# Patient Record
Sex: Male | Born: 1945
Health system: Southern US, Community
[De-identification: ages and names within clinical notes are randomized; demographics above are authoritative.]

## PROBLEM LIST (undated history)

## (undated) DIAGNOSIS — E785 Hyperlipidemia, unspecified: Secondary | ICD-10-CM

## (undated) DIAGNOSIS — I10 Essential (primary) hypertension: Secondary | ICD-10-CM

## (undated) HISTORY — DX: Hyperlipidemia, unspecified: E78.5

## (undated) HISTORY — DX: Essential (primary) hypertension: I10

---

## 1964-01-11 HISTORY — PX: INGUINAL HERNIA REPAIR: SUR1180

## 2001-01-17 ENCOUNTER — Ambulatory Visit (HOSPITAL_COMMUNITY): Admission: RE | Admit: 2001-01-17 | Discharge: 2001-01-17 | Payer: Self-pay | Admitting: Gastroenterology

## 2011-06-20 DIAGNOSIS — IMO0002 Reserved for concepts with insufficient information to code with codable children: Secondary | ICD-10-CM | POA: Diagnosis not present

## 2011-06-20 DIAGNOSIS — N529 Male erectile dysfunction, unspecified: Secondary | ICD-10-CM | POA: Diagnosis not present

## 2011-06-20 DIAGNOSIS — I10 Essential (primary) hypertension: Secondary | ICD-10-CM | POA: Diagnosis not present

## 2011-06-21 DIAGNOSIS — L988 Other specified disorders of the skin and subcutaneous tissue: Secondary | ICD-10-CM | POA: Diagnosis not present

## 2011-06-21 DIAGNOSIS — M79609 Pain in unspecified limb: Secondary | ICD-10-CM | POA: Diagnosis not present

## 2011-07-12 DIAGNOSIS — H524 Presbyopia: Secondary | ICD-10-CM | POA: Diagnosis not present

## 2011-07-12 DIAGNOSIS — H5200 Hypermetropia, unspecified eye: Secondary | ICD-10-CM | POA: Diagnosis not present

## 2011-07-12 DIAGNOSIS — H2510 Age-related nuclear cataract, unspecified eye: Secondary | ICD-10-CM | POA: Diagnosis not present

## 2011-08-10 DIAGNOSIS — Z125 Encounter for screening for malignant neoplasm of prostate: Secondary | ICD-10-CM | POA: Diagnosis not present

## 2011-08-10 DIAGNOSIS — I10 Essential (primary) hypertension: Secondary | ICD-10-CM | POA: Diagnosis not present

## 2011-08-10 DIAGNOSIS — E785 Hyperlipidemia, unspecified: Secondary | ICD-10-CM | POA: Diagnosis not present

## 2011-08-17 DIAGNOSIS — Z125 Encounter for screening for malignant neoplasm of prostate: Secondary | ICD-10-CM | POA: Diagnosis not present

## 2011-08-17 DIAGNOSIS — E785 Hyperlipidemia, unspecified: Secondary | ICD-10-CM | POA: Diagnosis not present

## 2011-08-17 DIAGNOSIS — Z0000 Encounter for general adult medical examination without abnormal findings: Secondary | ICD-10-CM | POA: Diagnosis not present

## 2011-08-17 DIAGNOSIS — J449 Chronic obstructive pulmonary disease, unspecified: Secondary | ICD-10-CM | POA: Diagnosis not present

## 2011-08-17 DIAGNOSIS — I10 Essential (primary) hypertension: Secondary | ICD-10-CM | POA: Diagnosis not present

## 2011-08-30 DIAGNOSIS — Z1389 Encounter for screening for other disorder: Secondary | ICD-10-CM | POA: Diagnosis not present

## 2012-09-05 DIAGNOSIS — D485 Neoplasm of uncertain behavior of skin: Secondary | ICD-10-CM | POA: Diagnosis not present

## 2012-09-05 DIAGNOSIS — L821 Other seborrheic keratosis: Secondary | ICD-10-CM | POA: Diagnosis not present

## 2012-09-05 DIAGNOSIS — L570 Actinic keratosis: Secondary | ICD-10-CM | POA: Diagnosis not present

## 2012-09-05 DIAGNOSIS — D2370 Other benign neoplasm of skin of unspecified lower limb, including hip: Secondary | ICD-10-CM | POA: Diagnosis not present

## 2012-09-05 DIAGNOSIS — D239 Other benign neoplasm of skin, unspecified: Secondary | ICD-10-CM | POA: Diagnosis not present

## 2012-09-05 DIAGNOSIS — L905 Scar conditions and fibrosis of skin: Secondary | ICD-10-CM | POA: Diagnosis not present

## 2012-09-19 DIAGNOSIS — H2510 Age-related nuclear cataract, unspecified eye: Secondary | ICD-10-CM | POA: Diagnosis not present

## 2012-09-19 DIAGNOSIS — H524 Presbyopia: Secondary | ICD-10-CM | POA: Diagnosis not present

## 2012-10-02 DIAGNOSIS — I10 Essential (primary) hypertension: Secondary | ICD-10-CM | POA: Diagnosis not present

## 2012-10-02 DIAGNOSIS — E785 Hyperlipidemia, unspecified: Secondary | ICD-10-CM | POA: Diagnosis not present

## 2012-10-02 DIAGNOSIS — Z125 Encounter for screening for malignant neoplasm of prostate: Secondary | ICD-10-CM | POA: Diagnosis not present

## 2012-10-09 DIAGNOSIS — Z125 Encounter for screening for malignant neoplasm of prostate: Secondary | ICD-10-CM | POA: Diagnosis not present

## 2012-10-09 DIAGNOSIS — J449 Chronic obstructive pulmonary disease, unspecified: Secondary | ICD-10-CM | POA: Diagnosis not present

## 2012-10-09 DIAGNOSIS — E785 Hyperlipidemia, unspecified: Secondary | ICD-10-CM | POA: Diagnosis not present

## 2012-10-09 DIAGNOSIS — N529 Male erectile dysfunction, unspecified: Secondary | ICD-10-CM | POA: Diagnosis not present

## 2012-10-09 DIAGNOSIS — I10 Essential (primary) hypertension: Secondary | ICD-10-CM | POA: Diagnosis not present

## 2012-10-09 DIAGNOSIS — Z0000 Encounter for general adult medical examination without abnormal findings: Secondary | ICD-10-CM | POA: Diagnosis not present

## 2012-10-09 DIAGNOSIS — L578 Other skin changes due to chronic exposure to nonionizing radiation: Secondary | ICD-10-CM | POA: Diagnosis not present

## 2012-10-09 DIAGNOSIS — Z23 Encounter for immunization: Secondary | ICD-10-CM | POA: Diagnosis not present

## 2012-10-09 DIAGNOSIS — M1990 Unspecified osteoarthritis, unspecified site: Secondary | ICD-10-CM | POA: Diagnosis not present

## 2012-10-24 DIAGNOSIS — Z1212 Encounter for screening for malignant neoplasm of rectum: Secondary | ICD-10-CM | POA: Diagnosis not present

## 2013-03-18 DIAGNOSIS — L819 Disorder of pigmentation, unspecified: Secondary | ICD-10-CM | POA: Diagnosis not present

## 2013-03-18 DIAGNOSIS — L259 Unspecified contact dermatitis, unspecified cause: Secondary | ICD-10-CM | POA: Diagnosis not present

## 2013-03-18 DIAGNOSIS — D1801 Hemangioma of skin and subcutaneous tissue: Secondary | ICD-10-CM | POA: Diagnosis not present

## 2013-03-18 DIAGNOSIS — L821 Other seborrheic keratosis: Secondary | ICD-10-CM | POA: Diagnosis not present

## 2013-03-18 DIAGNOSIS — D239 Other benign neoplasm of skin, unspecified: Secondary | ICD-10-CM | POA: Diagnosis not present

## 2013-03-18 DIAGNOSIS — L570 Actinic keratosis: Secondary | ICD-10-CM | POA: Diagnosis not present

## 2013-04-25 DIAGNOSIS — H9319 Tinnitus, unspecified ear: Secondary | ICD-10-CM | POA: Diagnosis not present

## 2013-04-25 DIAGNOSIS — H903 Sensorineural hearing loss, bilateral: Secondary | ICD-10-CM | POA: Diagnosis not present

## 2013-06-20 ENCOUNTER — Encounter (HOSPITAL_BASED_OUTPATIENT_CLINIC_OR_DEPARTMENT_OTHER): Payer: Self-pay

## 2013-06-20 ENCOUNTER — Ambulatory Visit (HOSPITAL_BASED_OUTPATIENT_CLINIC_OR_DEPARTMENT_OTHER)
Admission: RE | Admit: 2013-06-20 | Discharge: 2013-06-20 | Disposition: A | Discharge status: Home / Self Care | Payer: Medicare Other | Source: Ambulatory Visit | Attending: Orthopedic Surgery | Admitting: Orthopedic Surgery

## 2013-06-20 ENCOUNTER — Encounter (HOSPITAL_BASED_OUTPATIENT_CLINIC_OR_DEPARTMENT_OTHER)
Admission: RE | Disposition: A | Discharge status: Home / Self Care | Payer: Self-pay | Source: Ambulatory Visit | Attending: Orthopedic Surgery

## 2013-06-20 DIAGNOSIS — E7800 Pure hypercholesterolemia, unspecified: Secondary | ICD-10-CM | POA: Diagnosis not present

## 2013-06-20 DIAGNOSIS — S6710XA Crushing injury of unspecified finger(s), initial encounter: Secondary | ICD-10-CM | POA: Diagnosis not present

## 2013-06-20 DIAGNOSIS — S41109A Unspecified open wound of unspecified upper arm, initial encounter: Secondary | ICD-10-CM | POA: Diagnosis not present

## 2013-06-20 DIAGNOSIS — S6720XA Crushing injury of unspecified hand, initial encounter: Secondary | ICD-10-CM | POA: Diagnosis not present

## 2013-06-20 DIAGNOSIS — I10 Essential (primary) hypertension: Secondary | ICD-10-CM | POA: Insufficient documentation

## 2013-06-20 DIAGNOSIS — IMO0002 Reserved for concepts with insufficient information to code with codable children: Secondary | ICD-10-CM | POA: Insufficient documentation

## 2013-06-20 DIAGNOSIS — Y929 Unspecified place or not applicable: Secondary | ICD-10-CM | POA: Insufficient documentation

## 2013-06-20 HISTORY — PX: MINOR NAILBED REPAIR: SHX5979

## 2013-06-20 SURGERY — MINOR NAILBED REPAIR
Anesthesia: LOCAL | Laterality: Left

## 2013-06-20 MED ORDER — LIDOCAINE HCL 2 % IJ SOLN
INTRAMUSCULAR | Status: DC | PRN
  Administered 2013-06-20: 5.5 mL

## 2013-06-20 MED ORDER — 0.9 % SODIUM CHLORIDE (POUR BTL) OPTIME
TOPICAL | Status: DC | PRN
  Administered 2013-06-20: 200 mL

## 2013-06-20 MED ORDER — OXYCODONE-ACETAMINOPHEN 5-325 MG PO TABS: ORAL_TABLET | ORAL | Status: DC

## 2013-06-20 MED ORDER — LIDOCAINE HCL 2 % IJ SOLN
INTRAMUSCULAR | Status: AC
  Filled 2013-06-20: qty 20

## 2013-06-20 MED ORDER — DOXYCYCLINE HYCLATE 100 MG PO TABS: 100.0000 mg | ORAL_TABLET | Freq: Two times a day (BID) | ORAL | Status: AC

## 2013-06-20 SURGICAL SUPPLY — 42 items
BANDAGE ADH SHEER 1  50/CT (GAUZE/BANDAGES/DRESSINGS) IMPLANT
BANDAGE COBAN STERILE 2 (GAUZE/BANDAGES/DRESSINGS) IMPLANT
BLADE SURG 15 STRL LF DISP TIS (BLADE) ×1 IMPLANT
BLADE SURG 15 STRL SS (BLADE) ×1
BNDG COHESIVE 1X5 TAN STRL LF (GAUZE/BANDAGES/DRESSINGS) ×2 IMPLANT
BNDG ELASTIC 2 VLCR STRL LF (GAUZE/BANDAGES/DRESSINGS) IMPLANT
BNDG ESMARK 4X9 LF (GAUZE/BANDAGES/DRESSINGS) IMPLANT
BRUSH SCRUB EZ PLAIN DRY (MISCELLANEOUS) ×2 IMPLANT
CORDS BIPOLAR (ELECTRODE) IMPLANT
COVER MAYO STAND STRL (DRAPES) ×2 IMPLANT
CUFF TOURNIQUET SINGLE 18IN (TOURNIQUET CUFF) IMPLANT
DECANTER SPIKE VIAL GLASS SM (MISCELLANEOUS) IMPLANT
DRAIN PENROSE 1/2X12 LTX STRL (WOUND CARE) ×2 IMPLANT
DRAPE SURG 17X23 STRL (DRAPES) IMPLANT
GAUZE SPONGE 4X4 12PLY STRL (GAUZE/BANDAGES/DRESSINGS) IMPLANT
GAUZE XEROFORM 1X8 LF (GAUZE/BANDAGES/DRESSINGS) ×2 IMPLANT
GLOVE BIOGEL M STRL SZ7.5 (GLOVE) ×2 IMPLANT
GLOVE BIOGEL PI IND STRL 6.5 (GLOVE) ×1 IMPLANT
GLOVE BIOGEL PI IND STRL 7.0 (GLOVE) ×1 IMPLANT
GLOVE BIOGEL PI INDICATOR 6.5 (GLOVE) ×1
GLOVE BIOGEL PI INDICATOR 7.0 (GLOVE) ×1
GLOVE ORTHO TXT STRL SZ7.5 (GLOVE) ×2 IMPLANT
GOWN STRL REUS W/ TWL LRG LVL3 (GOWN DISPOSABLE) IMPLANT
GOWN STRL REUS W/ TWL XL LVL3 (GOWN DISPOSABLE) ×3 IMPLANT
GOWN STRL REUS W/TWL LRG LVL3 (GOWN DISPOSABLE)
GOWN STRL REUS W/TWL XL LVL3 (GOWN DISPOSABLE) ×3
NEEDLE 27GAX1X1/2 (NEEDLE) ×2 IMPLANT
PACK BASIN DAY SURGERY FS (CUSTOM PROCEDURE TRAY) ×2 IMPLANT
PADDING CAST ABS 4INX4YD NS (CAST SUPPLIES)
PADDING CAST ABS COTTON 4X4 ST (CAST SUPPLIES) IMPLANT
SPLINT FINGER 3.25 911903 (SOFTGOODS) ×2 IMPLANT
SPONGE GAUZE 4X4 12PLY STER LF (GAUZE/BANDAGES/DRESSINGS) IMPLANT
STOCKINETTE 4X48 STRL (DRAPES) ×2 IMPLANT
STRIP CLOSURE SKIN 1/2X4 (GAUZE/BANDAGES/DRESSINGS) IMPLANT
SUT ETHILON 5 0 P 3 18 (SUTURE)
SUT NYLON ETHILON 5-0 P-3 1X18 (SUTURE) IMPLANT
SUT PROLENE 4 0 P 3 18 (SUTURE) IMPLANT
SYR 3ML 23GX1 SAFETY (SYRINGE) IMPLANT
SYR CONTROL 10ML LL (SYRINGE) ×2 IMPLANT
TOWEL OR 17X24 6PK STRL BLUE (TOWEL DISPOSABLE) ×4 IMPLANT
TRAY DSU PREP LF (CUSTOM PROCEDURE TRAY) ×2 IMPLANT
UNDERPAD 30X30 INCONTINENT (UNDERPADS AND DIAPERS) ×2 IMPLANT

## 2013-06-20 NOTE — Discharge Instructions (Signed)
Keep left index finger dressing clean and dry for the next week. Elevate your hand above your heart for the next 2-3 days to decrease discomfort.  Take the doxycycline antibiotic morning and night for 4 days to prevent infection of the wound.  Cover the bandage with a plastic bag when showering.  Return to our office in 7-8 days for dressing change and wound assessment.

## 2013-06-20 NOTE — Op Note (Signed)
[id number] 

## 2013-06-20 NOTE — H&P (Signed)
  [name] is a 68 year old right-hand-dominant male who was working on some masonry when he struck the radial aspect of his left index finger distal phalanx with his hammer. He noted some bleeding and a obvious laceration to the radial aspect of the finger. His wife is a current patient in office. They decided to call our office for further evaluation and treatment.  Review of his past medical history reveals no known drug allergies. He does have a history of hypertension and hyper cholesterolemia. He is on medication for both of these problems. His primary care physician is Dr. Crist Miles at [facility]. The patient does not smoke or consume alcohol base beverages. He said his last tetanus shot was within the last 10 years.  Examination of his left index finger reveals and irregular laceration to the distal phalanx of the digit. This extends through the nail plate. He is intact has full motion of the PIP but he is quite hesitant to ask the DIP joint to to the pain at the x-ray examination reveals mild changes in the distal phalanx from osteoarthritis. There is no fracture seen however.  Impression: Crush injury to the left index finger distal phalanx  Plan: Patient to be taken to the operating room to undergo irrigation, debridement and repair of the wound in addition to nail bed repair as needed. The procedure risks benefits and postoperative course were discussed with the patient and his wife at length and they were in agreement with the plan.  Patrick Miles J.Patrick Miles. PA-C  H&P documentation: 06/20/2013  -History and Physical Reviewed  -Patient has been re-examined  -No change in the plan of care  Patrick Sickle, MD

## 2013-06-20 NOTE — Brief Op Note (Signed)
06/20/2013  3:43 PM  PATIENT:  Patrick Miles  68 y.o. male  PRE-OPERATIVE DIAGNOSIS:   Left index finger nailbed injury and radial nail wall degloving  POST-OPERATIVE DIAGNOSIS:   Left index finger nailbed injury and radial nail wall degloving  PROCEDURE:  Procedure(s): MINOR NAILBED REPAIR (Left)  SURGEON:  Surgeon(s) and Role:    * Cammie Sickle, MD - Primary  PHYSICIAN ASSISTANT:   ASSISTANTS: Kathyrn Sheriff.A-C    ANESTHESIA:   local and general  EBL:     BLOOD ADMINISTERED:none  DRAINS: none   LOCAL MEDICATIONS USED:  LIDOCAINE   SPECIMEN:  none  DISPOSITION OF SPECIMEN:  N/A  COUNTS:  YES  TOURNIQUET:  * No tourniquets in log *  DICTATION: .Other Dictation: Dictation Number [id number]  PLAN OF CARE: Discharge to home after PACU  PATIENT DISPOSITION:  PACU - hemodynamically stable.   Delay start of Pharmacological VTE agent (>24hrs) due to surgical blood loss or risk of bleeding: not applicable

## 2013-06-21 NOTE — Op Note (Signed)
[name], Miles NO.:  [id number]  MEDICAL RECORD NO.:  [id number]  LOCATION:                                 FACILITY:  PHYSICIAN:  Patrick Miles, M.D.      DATE OF BIRTH:  DATE OF PROCEDURE: DATE OF DISCHARGE:                              OPERATIVE REPORT   PREOPERATIVE DIAGNOSIS:  Rubber mallet direct blow crush injury of left index finger, distal phalangeal segment with degloving of radial nail wall and soiling deep to the nail plate.  POSTOPERATIVE DIAGNOSIS:  Rubber mallet direct blow crush injury of left index finger, distal phalangeal segment with degloving of radial nail wall and soiling deep to the nail plate.  OPERATION:  Irrigation and debridement of the nail wall and partial removal of the nail plate for debridement of environmental debris, followed by reconstruction of radial nail wall.  This laceration was approximately 2.5 cm in length, with degloving of the nail wall.  OPERATING SURGEON:  Patrick Mighty. Patrick Shivley, MD  ASSISTANT:  Patrick Lente Dasnoit, PA-C  ANESTHESIA:  A 2% lidocaine metacarpal head level block of left index finger.  This was a minor operating room procedure performed at the [facility].  INDICATIONS:  Patrick Miles is a 68 year old gentleman who was building a stone wall earlier today.  He missed with a rubber mallet smashing his left index finger against a rock.  He degloved the radial nail wall of his finger and injured his nail plate.  He had a significant pulp hematoma and avulsion of about a 2.5 cm portion of his radial nail wall.  He contacted our office and was advised due to the nature of his injury to come directly to the [facility].  We met him in the holding area, interviewed him.  We examined his wound and identified that he did not have a fracture on x-ray.  We advised irrigation and debridement of his wound and a minor operating room repair of his finger tip.  After detailed  informed consent, he was brought to the operating room at this time.  Preoperatively, he was reminded of potential risks and benefits of surgery.  His finger was marked per protocol with a marking pen as the proper surgical site.  DESCRIPTION OF PROCEDURE:  Patrick Miles was brought to room 1 of the [facility] and placed in supine position on the operating table.  Following Betadine prep, a 2% lidocaine was infiltrated in metacarpal head level to obtain a digital block.  After few moments, excellent anesthesia was achieved.  The left hand and arm were then prepped with Betadine soap and solution and sterilely draped.  After exsanguination of the left index finger with a gauze wrap, a 0.5-inch Penrose drain was placed to the proximal phalangeal segment as a digital tourniquet.  A routine surgical time-out was accomplished followed by removal of the radial 3 mm of nail plate.  There was quite a bit of grime and dirt beneath the nail plate.  This was then debrided with a wet sponge.  The soft tissues were thoroughly debrided.  The degloved area of dermis was irrigated thoroughly and  a small opening created to prevent sub flap hematoma.  The flap was then repaired with mattress sutures of 6-0 chromic. Multiple sutures were placed through the nail plate.  These will dissolve spontaneously in approximately 10 days, therefore relieving Patrick Miles the worry about having sutures removed around the nail.  The cosmetic result was quite satisfactory.  The finger was then dressed with Xeroflo sterile gauze and a wrist base Coban finger dressing.  For aftercare, Patrick Miles is provided prescriptions for Percocet 5 mg 1 tablet p.o. q.4-6 hours p.r.n. pain, 20 tabs without refill, also doxycycline 100 mg p.o. b.i.d. x4 days.  He will take the doxycycline with an 8 ounce glass of water.     Patrick Miles, M.D.     RVS/MEDQ  D:  06/20/2013  T:  06/21/2013  Job:   [id number]  cc:   Patrick Miles, M.D. 

## 2013-06-24 ENCOUNTER — Encounter (HOSPITAL_BASED_OUTPATIENT_CLINIC_OR_DEPARTMENT_OTHER): Payer: Self-pay | Admitting: Orthopedic Surgery

## 2013-10-02 DIAGNOSIS — H2510 Age-related nuclear cataract, unspecified eye: Secondary | ICD-10-CM | POA: Diagnosis not present

## 2013-10-31 DIAGNOSIS — E785 Hyperlipidemia, unspecified: Secondary | ICD-10-CM | POA: Diagnosis not present

## 2013-10-31 DIAGNOSIS — I10 Essential (primary) hypertension: Secondary | ICD-10-CM | POA: Diagnosis not present

## 2013-10-31 DIAGNOSIS — Z125 Encounter for screening for malignant neoplasm of prostate: Secondary | ICD-10-CM | POA: Diagnosis not present

## 2013-11-07 DIAGNOSIS — N529 Male erectile dysfunction, unspecified: Secondary | ICD-10-CM | POA: Diagnosis not present

## 2013-11-07 DIAGNOSIS — Z1389 Encounter for screening for other disorder: Secondary | ICD-10-CM | POA: Diagnosis not present

## 2013-11-07 DIAGNOSIS — I10 Essential (primary) hypertension: Secondary | ICD-10-CM | POA: Diagnosis not present

## 2013-11-07 DIAGNOSIS — Z23 Encounter for immunization: Secondary | ICD-10-CM | POA: Diagnosis not present

## 2013-11-07 DIAGNOSIS — Z0000 Encounter for general adult medical examination without abnormal findings: Secondary | ICD-10-CM | POA: Diagnosis not present

## 2013-11-07 DIAGNOSIS — E785 Hyperlipidemia, unspecified: Secondary | ICD-10-CM | POA: Diagnosis not present

## 2013-11-07 DIAGNOSIS — J449 Chronic obstructive pulmonary disease, unspecified: Secondary | ICD-10-CM | POA: Diagnosis not present

## 2013-11-07 DIAGNOSIS — Z6830 Body mass index (BMI) 30.0-30.9, adult: Secondary | ICD-10-CM | POA: Diagnosis not present

## 2013-11-27 DIAGNOSIS — Z1212 Encounter for screening for malignant neoplasm of rectum: Secondary | ICD-10-CM | POA: Diagnosis not present

## 2014-03-19 ENCOUNTER — Other Ambulatory Visit: Payer: Self-pay | Admitting: Dermatology

## 2014-03-19 DIAGNOSIS — D1722 Benign lipomatous neoplasm of skin and subcutaneous tissue of left arm: Secondary | ICD-10-CM | POA: Diagnosis not present

## 2014-03-19 DIAGNOSIS — C44719 Basal cell carcinoma of skin of left lower limb, including hip: Secondary | ICD-10-CM | POA: Diagnosis not present

## 2014-03-19 DIAGNOSIS — L814 Other melanin hyperpigmentation: Secondary | ICD-10-CM | POA: Diagnosis not present

## 2014-03-19 DIAGNOSIS — L570 Actinic keratosis: Secondary | ICD-10-CM | POA: Diagnosis not present

## 2014-03-19 DIAGNOSIS — D2271 Melanocytic nevi of right lower limb, including hip: Secondary | ICD-10-CM | POA: Diagnosis not present

## 2014-03-19 DIAGNOSIS — D225 Melanocytic nevi of trunk: Secondary | ICD-10-CM | POA: Diagnosis not present

## 2014-03-19 DIAGNOSIS — D1801 Hemangioma of skin and subcutaneous tissue: Secondary | ICD-10-CM | POA: Diagnosis not present

## 2014-03-19 DIAGNOSIS — D2262 Melanocytic nevi of left upper limb, including shoulder: Secondary | ICD-10-CM | POA: Diagnosis not present

## 2014-03-19 DIAGNOSIS — L821 Other seborrheic keratosis: Secondary | ICD-10-CM | POA: Diagnosis not present

## 2014-03-19 DIAGNOSIS — D2371 Other benign neoplasm of skin of right lower limb, including hip: Secondary | ICD-10-CM | POA: Diagnosis not present

## 2014-03-19 DIAGNOSIS — D2261 Melanocytic nevi of right upper limb, including shoulder: Secondary | ICD-10-CM | POA: Diagnosis not present

## 2014-03-19 DIAGNOSIS — D2272 Melanocytic nevi of left lower limb, including hip: Secondary | ICD-10-CM | POA: Diagnosis not present

## 2014-09-16 DIAGNOSIS — H2513 Age-related nuclear cataract, bilateral: Secondary | ICD-10-CM | POA: Diagnosis not present

## 2014-10-11 DIAGNOSIS — Z23 Encounter for immunization: Secondary | ICD-10-CM | POA: Diagnosis not present

## 2014-11-05 DIAGNOSIS — I10 Essential (primary) hypertension: Secondary | ICD-10-CM | POA: Diagnosis not present

## 2014-11-05 DIAGNOSIS — Z125 Encounter for screening for malignant neoplasm of prostate: Secondary | ICD-10-CM | POA: Diagnosis not present

## 2014-11-05 DIAGNOSIS — E785 Hyperlipidemia, unspecified: Secondary | ICD-10-CM | POA: Diagnosis not present

## 2014-11-12 DIAGNOSIS — M1990 Unspecified osteoarthritis, unspecified site: Secondary | ICD-10-CM | POA: Diagnosis not present

## 2014-11-12 DIAGNOSIS — E785 Hyperlipidemia, unspecified: Secondary | ICD-10-CM | POA: Diagnosis not present

## 2014-11-12 DIAGNOSIS — L568 Other specified acute skin changes due to ultraviolet radiation: Secondary | ICD-10-CM | POA: Diagnosis not present

## 2014-11-12 DIAGNOSIS — Z23 Encounter for immunization: Secondary | ICD-10-CM | POA: Diagnosis not present

## 2014-11-12 DIAGNOSIS — Z6828 Body mass index (BMI) 28.0-28.9, adult: Secondary | ICD-10-CM | POA: Diagnosis not present

## 2014-11-12 DIAGNOSIS — Z1389 Encounter for screening for other disorder: Secondary | ICD-10-CM | POA: Diagnosis not present

## 2014-11-12 DIAGNOSIS — N401 Enlarged prostate with lower urinary tract symptoms: Secondary | ICD-10-CM | POA: Diagnosis not present

## 2014-11-12 DIAGNOSIS — J449 Chronic obstructive pulmonary disease, unspecified: Secondary | ICD-10-CM | POA: Diagnosis not present

## 2014-11-12 DIAGNOSIS — Z0000 Encounter for general adult medical examination without abnormal findings: Secondary | ICD-10-CM | POA: Diagnosis not present

## 2014-11-12 DIAGNOSIS — N529 Male erectile dysfunction, unspecified: Secondary | ICD-10-CM | POA: Diagnosis not present

## 2014-11-12 DIAGNOSIS — D126 Benign neoplasm of colon, unspecified: Secondary | ICD-10-CM | POA: Diagnosis not present

## 2014-11-12 DIAGNOSIS — I10 Essential (primary) hypertension: Secondary | ICD-10-CM | POA: Diagnosis not present

## 2014-11-13 DIAGNOSIS — Z1212 Encounter for screening for malignant neoplasm of rectum: Secondary | ICD-10-CM | POA: Diagnosis not present

## 2014-12-08 DIAGNOSIS — H9313 Tinnitus, bilateral: Secondary | ICD-10-CM | POA: Diagnosis not present

## 2014-12-08 DIAGNOSIS — H903 Sensorineural hearing loss, bilateral: Secondary | ICD-10-CM | POA: Diagnosis not present

## 2015-03-24 DIAGNOSIS — D1801 Hemangioma of skin and subcutaneous tissue: Secondary | ICD-10-CM | POA: Diagnosis not present

## 2015-03-24 DIAGNOSIS — D2271 Melanocytic nevi of right lower limb, including hip: Secondary | ICD-10-CM | POA: Diagnosis not present

## 2015-03-24 DIAGNOSIS — L821 Other seborrheic keratosis: Secondary | ICD-10-CM | POA: Diagnosis not present

## 2015-03-24 DIAGNOSIS — D225 Melanocytic nevi of trunk: Secondary | ICD-10-CM | POA: Diagnosis not present

## 2015-03-24 DIAGNOSIS — Z85828 Personal history of other malignant neoplasm of skin: Secondary | ICD-10-CM | POA: Diagnosis not present

## 2015-03-24 DIAGNOSIS — D2372 Other benign neoplasm of skin of left lower limb, including hip: Secondary | ICD-10-CM | POA: Diagnosis not present

## 2015-03-24 DIAGNOSIS — D2262 Melanocytic nevi of left upper limb, including shoulder: Secondary | ICD-10-CM | POA: Diagnosis not present

## 2015-03-24 DIAGNOSIS — L814 Other melanin hyperpigmentation: Secondary | ICD-10-CM | POA: Diagnosis not present

## 2015-03-24 DIAGNOSIS — L570 Actinic keratosis: Secondary | ICD-10-CM | POA: Diagnosis not present

## 2015-08-17 DIAGNOSIS — K5730 Diverticulosis of large intestine without perforation or abscess without bleeding: Secondary | ICD-10-CM | POA: Diagnosis not present

## 2015-08-17 DIAGNOSIS — Z86010 Personal history of colonic polyps: Secondary | ICD-10-CM | POA: Diagnosis not present

## 2015-08-27 DIAGNOSIS — J351 Hypertrophy of tonsils: Secondary | ICD-10-CM | POA: Diagnosis not present

## 2015-08-27 DIAGNOSIS — Z6827 Body mass index (BMI) 27.0-27.9, adult: Secondary | ICD-10-CM | POA: Diagnosis not present

## 2015-08-27 DIAGNOSIS — Z23 Encounter for immunization: Secondary | ICD-10-CM | POA: Diagnosis not present

## 2015-08-27 DIAGNOSIS — I7789 Other specified disorders of arteries and arterioles: Secondary | ICD-10-CM | POA: Diagnosis not present

## 2015-08-31 ENCOUNTER — Other Ambulatory Visit (HOSPITAL_COMMUNITY): Payer: Self-pay | Admitting: Internal Medicine

## 2015-08-31 ENCOUNTER — Ambulatory Visit (HOSPITAL_COMMUNITY)
Admission: RE | Admit: 2015-08-31 | Discharge: 2015-08-31 | Disposition: A | Discharge status: Home / Self Care | Payer: Medicare Other | Source: Ambulatory Visit | Attending: Vascular Surgery | Admitting: Vascular Surgery

## 2015-08-31 DIAGNOSIS — I779 Disorder of arteries and arterioles, unspecified: Secondary | ICD-10-CM | POA: Insufficient documentation

## 2015-09-18 DIAGNOSIS — H2513 Age-related nuclear cataract, bilateral: Secondary | ICD-10-CM | POA: Diagnosis not present

## 2015-09-18 DIAGNOSIS — H5203 Hypermetropia, bilateral: Secondary | ICD-10-CM | POA: Diagnosis not present

## 2015-10-30 DIAGNOSIS — H9313 Tinnitus, bilateral: Secondary | ICD-10-CM | POA: Diagnosis not present

## 2015-10-30 DIAGNOSIS — H903 Sensorineural hearing loss, bilateral: Secondary | ICD-10-CM | POA: Diagnosis not present

## 2015-11-24 DIAGNOSIS — Z125 Encounter for screening for malignant neoplasm of prostate: Secondary | ICD-10-CM | POA: Diagnosis not present

## 2015-11-24 DIAGNOSIS — E785 Hyperlipidemia, unspecified: Secondary | ICD-10-CM | POA: Diagnosis not present

## 2015-11-24 DIAGNOSIS — I10 Essential (primary) hypertension: Secondary | ICD-10-CM | POA: Diagnosis not present

## 2015-12-01 DIAGNOSIS — E784 Other hyperlipidemia: Secondary | ICD-10-CM | POA: Diagnosis not present

## 2015-12-01 DIAGNOSIS — I10 Essential (primary) hypertension: Secondary | ICD-10-CM | POA: Diagnosis not present

## 2015-12-01 DIAGNOSIS — I779 Disorder of arteries and arterioles, unspecified: Secondary | ICD-10-CM | POA: Diagnosis not present

## 2015-12-01 DIAGNOSIS — J449 Chronic obstructive pulmonary disease, unspecified: Secondary | ICD-10-CM | POA: Diagnosis not present

## 2015-12-01 DIAGNOSIS — N401 Enlarged prostate with lower urinary tract symptoms: Secondary | ICD-10-CM | POA: Diagnosis not present

## 2015-12-01 DIAGNOSIS — N529 Male erectile dysfunction, unspecified: Secondary | ICD-10-CM | POA: Diagnosis not present

## 2015-12-01 DIAGNOSIS — Z6828 Body mass index (BMI) 28.0-28.9, adult: Secondary | ICD-10-CM | POA: Diagnosis not present

## 2015-12-01 DIAGNOSIS — Z0000 Encounter for general adult medical examination without abnormal findings: Secondary | ICD-10-CM | POA: Diagnosis not present

## 2015-12-01 DIAGNOSIS — Z23 Encounter for immunization: Secondary | ICD-10-CM | POA: Diagnosis not present

## 2015-12-01 DIAGNOSIS — H8110 Benign paroxysmal vertigo, unspecified ear: Secondary | ICD-10-CM | POA: Diagnosis not present

## 2015-12-01 DIAGNOSIS — Z1389 Encounter for screening for other disorder: Secondary | ICD-10-CM | POA: Diagnosis not present

## 2015-12-01 DIAGNOSIS — D126 Benign neoplasm of colon, unspecified: Secondary | ICD-10-CM | POA: Diagnosis not present

## 2016-03-23 DIAGNOSIS — L820 Inflamed seborrheic keratosis: Secondary | ICD-10-CM | POA: Diagnosis not present

## 2016-03-23 DIAGNOSIS — D2372 Other benign neoplasm of skin of left lower limb, including hip: Secondary | ICD-10-CM | POA: Diagnosis not present

## 2016-03-23 DIAGNOSIS — L570 Actinic keratosis: Secondary | ICD-10-CM | POA: Diagnosis not present

## 2016-03-23 DIAGNOSIS — D225 Melanocytic nevi of trunk: Secondary | ICD-10-CM | POA: Diagnosis not present

## 2016-03-23 DIAGNOSIS — L2089 Other atopic dermatitis: Secondary | ICD-10-CM | POA: Diagnosis not present

## 2016-03-23 DIAGNOSIS — L821 Other seborrheic keratosis: Secondary | ICD-10-CM | POA: Diagnosis not present

## 2016-03-23 DIAGNOSIS — D2272 Melanocytic nevi of left lower limb, including hip: Secondary | ICD-10-CM | POA: Diagnosis not present

## 2016-03-23 DIAGNOSIS — D2271 Melanocytic nevi of right lower limb, including hip: Secondary | ICD-10-CM | POA: Diagnosis not present

## 2016-03-23 DIAGNOSIS — D2261 Melanocytic nevi of right upper limb, including shoulder: Secondary | ICD-10-CM | POA: Diagnosis not present

## 2016-08-10 DIAGNOSIS — H903 Sensorineural hearing loss, bilateral: Secondary | ICD-10-CM | POA: Diagnosis not present

## 2016-08-10 DIAGNOSIS — H9313 Tinnitus, bilateral: Secondary | ICD-10-CM | POA: Diagnosis not present

## 2016-09-19 DIAGNOSIS — H5202 Hypermetropia, left eye: Secondary | ICD-10-CM | POA: Diagnosis not present

## 2016-09-19 DIAGNOSIS — H2513 Age-related nuclear cataract, bilateral: Secondary | ICD-10-CM | POA: Diagnosis not present

## 2016-11-05 DIAGNOSIS — Z23 Encounter for immunization: Secondary | ICD-10-CM | POA: Diagnosis not present

## 2017-01-17 DIAGNOSIS — R82998 Other abnormal findings in urine: Secondary | ICD-10-CM | POA: Diagnosis not present

## 2017-01-17 DIAGNOSIS — E7849 Other hyperlipidemia: Secondary | ICD-10-CM | POA: Diagnosis not present

## 2017-01-17 DIAGNOSIS — I10 Essential (primary) hypertension: Secondary | ICD-10-CM | POA: Diagnosis not present

## 2017-01-17 DIAGNOSIS — Z125 Encounter for screening for malignant neoplasm of prostate: Secondary | ICD-10-CM | POA: Diagnosis not present

## 2017-01-24 DIAGNOSIS — N528 Other male erectile dysfunction: Secondary | ICD-10-CM | POA: Diagnosis not present

## 2017-01-24 DIAGNOSIS — E7849 Other hyperlipidemia: Secondary | ICD-10-CM | POA: Diagnosis not present

## 2017-01-24 DIAGNOSIS — Z0000 Encounter for general adult medical examination without abnormal findings: Secondary | ICD-10-CM | POA: Diagnosis not present

## 2017-01-24 DIAGNOSIS — D126 Benign neoplasm of colon, unspecified: Secondary | ICD-10-CM | POA: Diagnosis not present

## 2017-01-24 DIAGNOSIS — L568 Other specified acute skin changes due to ultraviolet radiation: Secondary | ICD-10-CM | POA: Diagnosis not present

## 2017-01-24 DIAGNOSIS — Z6828 Body mass index (BMI) 28.0-28.9, adult: Secondary | ICD-10-CM | POA: Diagnosis not present

## 2017-01-24 DIAGNOSIS — M1990 Unspecified osteoarthritis, unspecified site: Secondary | ICD-10-CM | POA: Diagnosis not present

## 2017-01-24 DIAGNOSIS — I779 Disorder of arteries and arterioles, unspecified: Secondary | ICD-10-CM | POA: Diagnosis not present

## 2017-01-24 DIAGNOSIS — Z1389 Encounter for screening for other disorder: Secondary | ICD-10-CM | POA: Diagnosis not present

## 2017-01-24 DIAGNOSIS — I10 Essential (primary) hypertension: Secondary | ICD-10-CM | POA: Diagnosis not present

## 2017-01-24 DIAGNOSIS — H9193 Unspecified hearing loss, bilateral: Secondary | ICD-10-CM | POA: Diagnosis not present

## 2017-01-24 DIAGNOSIS — J449 Chronic obstructive pulmonary disease, unspecified: Secondary | ICD-10-CM | POA: Diagnosis not present

## 2017-02-03 DIAGNOSIS — C44519 Basal cell carcinoma of skin of other part of trunk: Secondary | ICD-10-CM | POA: Diagnosis not present

## 2017-02-03 DIAGNOSIS — D2261 Melanocytic nevi of right upper limb, including shoulder: Secondary | ICD-10-CM | POA: Diagnosis not present

## 2017-02-03 DIAGNOSIS — D2262 Melanocytic nevi of left upper limb, including shoulder: Secondary | ICD-10-CM | POA: Diagnosis not present

## 2017-02-03 DIAGNOSIS — D225 Melanocytic nevi of trunk: Secondary | ICD-10-CM | POA: Diagnosis not present

## 2017-02-03 DIAGNOSIS — L814 Other melanin hyperpigmentation: Secondary | ICD-10-CM | POA: Diagnosis not present

## 2017-02-03 DIAGNOSIS — D2372 Other benign neoplasm of skin of left lower limb, including hip: Secondary | ICD-10-CM | POA: Diagnosis not present

## 2017-02-03 DIAGNOSIS — L570 Actinic keratosis: Secondary | ICD-10-CM | POA: Diagnosis not present

## 2017-02-03 DIAGNOSIS — Z1212 Encounter for screening for malignant neoplasm of rectum: Secondary | ICD-10-CM | POA: Diagnosis not present

## 2017-02-03 DIAGNOSIS — L821 Other seborrheic keratosis: Secondary | ICD-10-CM | POA: Diagnosis not present

## 2017-02-03 DIAGNOSIS — D1801 Hemangioma of skin and subcutaneous tissue: Secondary | ICD-10-CM | POA: Diagnosis not present

## 2017-02-03 DIAGNOSIS — D2271 Melanocytic nevi of right lower limb, including hip: Secondary | ICD-10-CM | POA: Diagnosis not present

## 2017-02-21 DIAGNOSIS — Z85828 Personal history of other malignant neoplasm of skin: Secondary | ICD-10-CM | POA: Diagnosis not present

## 2017-02-21 DIAGNOSIS — C44519 Basal cell carcinoma of skin of other part of trunk: Secondary | ICD-10-CM | POA: Diagnosis not present

## 2017-04-07 DIAGNOSIS — R3915 Urgency of urination: Secondary | ICD-10-CM | POA: Diagnosis not present

## 2017-04-20 DIAGNOSIS — Z6828 Body mass index (BMI) 28.0-28.9, adult: Secondary | ICD-10-CM | POA: Diagnosis not present

## 2017-04-20 DIAGNOSIS — R3915 Urgency of urination: Secondary | ICD-10-CM | POA: Diagnosis not present

## 2017-04-20 DIAGNOSIS — N3281 Overactive bladder: Secondary | ICD-10-CM | POA: Diagnosis not present

## 2017-08-15 DIAGNOSIS — H903 Sensorineural hearing loss, bilateral: Secondary | ICD-10-CM | POA: Diagnosis not present

## 2017-08-15 DIAGNOSIS — H9313 Tinnitus, bilateral: Secondary | ICD-10-CM | POA: Diagnosis not present

## 2017-09-19 DIAGNOSIS — H2513 Age-related nuclear cataract, bilateral: Secondary | ICD-10-CM | POA: Diagnosis not present

## 2017-09-19 DIAGNOSIS — H5213 Myopia, bilateral: Secondary | ICD-10-CM | POA: Diagnosis not present

## 2017-09-20 DIAGNOSIS — B351 Tinea unguium: Secondary | ICD-10-CM | POA: Diagnosis not present

## 2017-09-20 DIAGNOSIS — M79671 Pain in right foot: Secondary | ICD-10-CM | POA: Diagnosis not present

## 2017-09-20 DIAGNOSIS — M21961 Unspecified acquired deformity of right lower leg: Secondary | ICD-10-CM | POA: Diagnosis not present

## 2017-09-21 DIAGNOSIS — M47816 Spondylosis without myelopathy or radiculopathy, lumbar region: Secondary | ICD-10-CM | POA: Diagnosis not present

## 2017-09-26 DIAGNOSIS — M545 Low back pain: Secondary | ICD-10-CM | POA: Diagnosis not present

## 2017-09-26 DIAGNOSIS — M47896 Other spondylosis, lumbar region: Secondary | ICD-10-CM | POA: Diagnosis not present

## 2017-10-03 DIAGNOSIS — M47896 Other spondylosis, lumbar region: Secondary | ICD-10-CM | POA: Diagnosis not present

## 2017-10-03 DIAGNOSIS — M545 Low back pain: Secondary | ICD-10-CM | POA: Diagnosis not present

## 2017-10-10 DIAGNOSIS — M545 Low back pain: Secondary | ICD-10-CM | POA: Diagnosis not present

## 2017-10-10 DIAGNOSIS — M47896 Other spondylosis, lumbar region: Secondary | ICD-10-CM | POA: Diagnosis not present

## 2017-10-14 DIAGNOSIS — Z23 Encounter for immunization: Secondary | ICD-10-CM | POA: Diagnosis not present

## 2017-10-17 DIAGNOSIS — M545 Low back pain: Secondary | ICD-10-CM | POA: Diagnosis not present

## 2017-10-17 DIAGNOSIS — M47896 Other spondylosis, lumbar region: Secondary | ICD-10-CM | POA: Diagnosis not present

## 2017-12-01 DIAGNOSIS — K2970 Gastritis, unspecified, without bleeding: Secondary | ICD-10-CM | POA: Diagnosis not present

## 2017-12-01 DIAGNOSIS — R9431 Abnormal electrocardiogram [ECG] [EKG]: Secondary | ICD-10-CM | POA: Diagnosis not present

## 2017-12-01 DIAGNOSIS — Z6828 Body mass index (BMI) 28.0-28.9, adult: Secondary | ICD-10-CM | POA: Diagnosis not present

## 2017-12-01 DIAGNOSIS — R509 Fever, unspecified: Secondary | ICD-10-CM | POA: Diagnosis not present

## 2017-12-22 ENCOUNTER — Ambulatory Visit: Payer: BLUE CROSS/BLUE SHIELD | Admitting: Cardiology

## 2017-12-29 DIAGNOSIS — Z6828 Body mass index (BMI) 28.0-28.9, adult: Secondary | ICD-10-CM | POA: Diagnosis not present

## 2017-12-29 DIAGNOSIS — K2970 Gastritis, unspecified, without bleeding: Secondary | ICD-10-CM | POA: Diagnosis not present

## 2017-12-29 DIAGNOSIS — K219 Gastro-esophageal reflux disease without esophagitis: Secondary | ICD-10-CM | POA: Diagnosis not present

## 2018-01-20 NOTE — Progress Notes (Signed)
Cardiology Office Note   Date:  01/22/2018   ID:  Patrick Miles, Patrick Miles Jul 19, 1945, MRN [id number]  PCP:  Crist Infante, MD  Cardiologist:   Byanca Kasper [place]ique, MD   Chief Complaint  Patient presents with  . New Patient (Initial Visit)  . Shortness of Breath      History of Present Illness: Patrick Miles is a 73 y.o. male who is seen at the request of Dr. Joylene Draft for evaluation of an abnormal Ecg. He has a history of hypercholesterolemia and borderline HTN. Thinks his mother had some type of heart problem but not sure what. Very remote history of smoking. Was seen by primary care with gastric complaints related to taking meloxicam. This has resolved. Mentioned that he had a sensation more of numbness in his left arm a couple of times in October while sitting in a chair. No pain. Denies any chest pain, dyspnea or arm discomfort with activity. Ecg done showing LAD but otherwise normal. No prior cardiac history.     History reviewed. No pertinent past medical history.  Past Surgical History:  Procedure Laterality Date  . 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 MINOR NAILBED REPAIR Left 06/20/2013   Procedure: MINOR NAILBED REPAIR;  Surgeon: Cammie Sickle, MD;  Location: [facility];  Service: Orthopedics;  Laterality: Left;     Current Outpatient Medications  Medication Sig Dispense Refill  . doxycycline (VIBRA-TABS) 100 MG tablet Take 1 tablet (100 mg total) by mouth 2 (two) times daily. 8 tablet 0  . lisinopril (PRINIVIL,ZESTRIL) 40 MG tablet Take 40 mg by mouth daily.    . pravastatin (PRAVACHOL) 40 MG tablet Take 40 mg by mouth daily.     No current facility-administered medications for this visit.     Allergies:   Patient has no known allergies.    Social History:  The patient  reports that he quit smoking about 42 years ago. His smoking use included cigarettes. He has quit using smokeless tobacco.  His smokeless tobacco use included  chew. He reports current alcohol use of about 1.0 standard drinks of alcohol per week. He reports that he does not use drugs.   Family History:  The patient's family history includes Heart disease in his mother.    ROS:  Please see the history of present illness.   Otherwise, review of systems are positive for none.   All other systems are reviewed and negative.    PHYSICAL EXAM: VS:  BP 120/82   Pulse 71   Ht 6\' 4"  (1.93 m)   Wt 234 lb (106.1 kg)   BMI 28.48 kg/m  , BMI Body mass index is 28.48 kg/m. GEN: Well nourished, well developed, in no acute distress  HEENT: normal  Neck: no JVD, carotid bruits, or masses Cardiac: RRR; no murmurs, rubs, or gallops,no edema  Respiratory:  clear to auscultation bilaterally, normal work of breathing GI: soft, nontender, nondistended, + BS MS: no deformity or atrophy  Skin: warm and dry, no rash Neuro:  Strength and sensation are intact Psych: euthymic mood, full affect   EKG:  EKG is ordered today. The ekg ordered today demonstrates NSR with borderline low voltage. Axis -29. I have personally reviewed and interpreted this study.    Recent Labs: No results found for requested labs within last 8760 hours.    Lipid Panel No results found for: CHOL, TRIG, HDL, CHOLHDL, VLDL, LDLCALC, LDLDIRECT    Wt  Readings from Last 3 Encounters:  01/22/18 234 lb (106.1 kg)      Other studies Reviewed: Additional studies/ records that were reviewed today include:  Labs dated 01/17/17: cholesterol 177, triglycerides 43, HDL 65, LDL 103. ALT and TSH normal Dated 12/01/17: Hgb and creatinine normal.   ASSESSMENT AND PLAN:  1.  Vague symptoms of left arm numbness at rest. Resolved. No exertional symptoms of chest pain, arm pain or dyspnea.  2.  Left axis deviation noted on Ecg. Otherwise normal Ecg.  3.  Hypercholesterolemia on statin.   Based on his history today he does not appear to have any anginal symptoms. His exam is benign. Ecg without  ischemic changes. For now would just observe. If he should develop symptoms of chest pain or dyspnea in the future we could re-evaluate with stress testing.   Current medicines are reviewed at length with the patient today.  The patient does not have concerns regarding medicines.  The following changes have been made:  no change  Labs/ tests ordered today include:  No orders of the defined types were placed in this encounter.    Disposition:   FU PRN   Signed, Khyler Urda Martinique, MD  01/22/2018 8:52 AM    [facility] [address], [place], [place], [address] Phone [phone], Fax [phone]

## 2018-01-22 ENCOUNTER — Encounter: Payer: Self-pay | Admitting: Cardiology

## 2018-01-22 ENCOUNTER — Ambulatory Visit (INDEPENDENT_AMBULATORY_CARE_PROVIDER_SITE_OTHER): Payer: Medicare Other | Admitting: Cardiology

## 2018-01-22 VITALS — BP 120/82 | HR 71 | Ht 76.0 in | Wt 234.0 lb

## 2018-01-22 DIAGNOSIS — R200 Anesthesia of skin: Secondary | ICD-10-CM

## 2018-01-22 DIAGNOSIS — R9431 Abnormal electrocardiogram [ECG] [EKG]: Secondary | ICD-10-CM

## 2018-01-25 DIAGNOSIS — R82998 Other abnormal findings in urine: Secondary | ICD-10-CM | POA: Diagnosis not present

## 2018-01-25 DIAGNOSIS — Z125 Encounter for screening for malignant neoplasm of prostate: Secondary | ICD-10-CM | POA: Diagnosis not present

## 2018-01-25 DIAGNOSIS — I10 Essential (primary) hypertension: Secondary | ICD-10-CM | POA: Diagnosis not present

## 2018-01-25 DIAGNOSIS — E7849 Other hyperlipidemia: Secondary | ICD-10-CM | POA: Diagnosis not present

## 2018-02-01 DIAGNOSIS — I779 Disorder of arteries and arterioles, unspecified: Secondary | ICD-10-CM | POA: Diagnosis not present

## 2018-02-01 DIAGNOSIS — L988 Other specified disorders of the skin and subcutaneous tissue: Secondary | ICD-10-CM | POA: Diagnosis not present

## 2018-02-01 DIAGNOSIS — K2970 Gastritis, unspecified, without bleeding: Secondary | ICD-10-CM | POA: Diagnosis not present

## 2018-02-01 DIAGNOSIS — Z1339 Encounter for screening examination for other mental health and behavioral disorders: Secondary | ICD-10-CM | POA: Diagnosis not present

## 2018-02-01 DIAGNOSIS — Z1331 Encounter for screening for depression: Secondary | ICD-10-CM | POA: Diagnosis not present

## 2018-02-01 DIAGNOSIS — Z6828 Body mass index (BMI) 28.0-28.9, adult: Secondary | ICD-10-CM | POA: Diagnosis not present

## 2018-02-01 DIAGNOSIS — H8113 Benign paroxysmal vertigo, bilateral: Secondary | ICD-10-CM | POA: Diagnosis not present

## 2018-02-01 DIAGNOSIS — R9431 Abnormal electrocardiogram [ECG] [EKG]: Secondary | ICD-10-CM | POA: Diagnosis not present

## 2018-02-01 DIAGNOSIS — N3281 Overactive bladder: Secondary | ICD-10-CM | POA: Diagnosis not present

## 2018-02-01 DIAGNOSIS — K219 Gastro-esophageal reflux disease without esophagitis: Secondary | ICD-10-CM | POA: Diagnosis not present

## 2018-02-01 DIAGNOSIS — Z0000 Encounter for general adult medical examination without abnormal findings: Secondary | ICD-10-CM | POA: Diagnosis not present

## 2018-02-01 DIAGNOSIS — R3915 Urgency of urination: Secondary | ICD-10-CM | POA: Diagnosis not present

## 2018-02-02 DIAGNOSIS — Z1212 Encounter for screening for malignant neoplasm of rectum: Secondary | ICD-10-CM | POA: Diagnosis not present

## 2018-02-07 DIAGNOSIS — D2272 Melanocytic nevi of left lower limb, including hip: Secondary | ICD-10-CM | POA: Diagnosis not present

## 2018-02-07 DIAGNOSIS — Z85828 Personal history of other malignant neoplasm of skin: Secondary | ICD-10-CM | POA: Diagnosis not present

## 2018-02-07 DIAGNOSIS — D2261 Melanocytic nevi of right upper limb, including shoulder: Secondary | ICD-10-CM | POA: Diagnosis not present

## 2018-02-07 DIAGNOSIS — D2262 Melanocytic nevi of left upper limb, including shoulder: Secondary | ICD-10-CM | POA: Diagnosis not present

## 2018-02-07 DIAGNOSIS — C44619 Basal cell carcinoma of skin of left upper limb, including shoulder: Secondary | ICD-10-CM | POA: Diagnosis not present

## 2018-02-07 DIAGNOSIS — L570 Actinic keratosis: Secondary | ICD-10-CM | POA: Diagnosis not present

## 2018-02-07 DIAGNOSIS — D2372 Other benign neoplasm of skin of left lower limb, including hip: Secondary | ICD-10-CM | POA: Diagnosis not present

## 2018-02-07 DIAGNOSIS — D225 Melanocytic nevi of trunk: Secondary | ICD-10-CM | POA: Diagnosis not present

## 2018-02-07 DIAGNOSIS — L308 Other specified dermatitis: Secondary | ICD-10-CM | POA: Diagnosis not present

## 2018-02-07 DIAGNOSIS — L821 Other seborrheic keratosis: Secondary | ICD-10-CM | POA: Diagnosis not present

## 2018-02-07 DIAGNOSIS — D2271 Melanocytic nevi of right lower limb, including hip: Secondary | ICD-10-CM | POA: Diagnosis not present

## 2018-09-05 DIAGNOSIS — Z85828 Personal history of other malignant neoplasm of skin: Secondary | ICD-10-CM | POA: Diagnosis not present

## 2018-09-05 DIAGNOSIS — L570 Actinic keratosis: Secondary | ICD-10-CM | POA: Diagnosis not present

## 2018-09-05 DIAGNOSIS — L821 Other seborrheic keratosis: Secondary | ICD-10-CM | POA: Diagnosis not present

## 2018-09-05 DIAGNOSIS — D0439 Carcinoma in situ of skin of other parts of face: Secondary | ICD-10-CM | POA: Diagnosis not present

## 2018-09-05 DIAGNOSIS — L814 Other melanin hyperpigmentation: Secondary | ICD-10-CM | POA: Diagnosis not present

## 2018-09-22 DIAGNOSIS — Z23 Encounter for immunization: Secondary | ICD-10-CM | POA: Diagnosis not present

## 2018-10-01 DIAGNOSIS — H2513 Age-related nuclear cataract, bilateral: Secondary | ICD-10-CM | POA: Diagnosis not present

## 2019-02-01 ENCOUNTER — Ambulatory Visit: Payer: Medicare Other | Attending: Internal Medicine

## 2019-02-01 DIAGNOSIS — Z23 Encounter for immunization: Secondary | ICD-10-CM | POA: Insufficient documentation

## 2019-02-01 NOTE — Progress Notes (Signed)
   Covid-19 Vaccination Clinic  Name:  Patrick Miles    MRN: [id number] DOB: January 14, 1945  02/01/2019  Patrick Miles was observed post Covid-19 immunization for 15 minutes without incidence. He was provided with Vaccine Information Sheet and instruction to access the V-Safe system.   Patrick Miles was instructed to call 911 with any severe reactions post vaccine: Marland Kitchen Difficulty breathing  . Swelling of your face and throat  . A fast heartbeat  . A bad rash all over your body  . Dizziness and weakness    Immunizations Administered    Name Date Dose VIS Date Route   Pfizer COVID-19 Vaccine 02/01/2019 10:10 AM 0.3 mL 12/21/2018 Intramuscular   Manufacturer: [place]   Lot: [id number]   [place]: [id number]

## 2019-02-07 DIAGNOSIS — C44622 Squamous cell carcinoma of skin of right upper limb, including shoulder: Secondary | ICD-10-CM | POA: Diagnosis not present

## 2019-02-07 DIAGNOSIS — D2371 Other benign neoplasm of skin of right lower limb, including hip: Secondary | ICD-10-CM | POA: Diagnosis not present

## 2019-02-07 DIAGNOSIS — Z85828 Personal history of other malignant neoplasm of skin: Secondary | ICD-10-CM | POA: Diagnosis not present

## 2019-02-07 DIAGNOSIS — D225 Melanocytic nevi of trunk: Secondary | ICD-10-CM | POA: Diagnosis not present

## 2019-02-07 DIAGNOSIS — D2261 Melanocytic nevi of right upper limb, including shoulder: Secondary | ICD-10-CM | POA: Diagnosis not present

## 2019-02-07 DIAGNOSIS — L821 Other seborrheic keratosis: Secondary | ICD-10-CM | POA: Diagnosis not present

## 2019-02-07 DIAGNOSIS — D2262 Melanocytic nevi of left upper limb, including shoulder: Secondary | ICD-10-CM | POA: Diagnosis not present

## 2019-02-07 DIAGNOSIS — L308 Other specified dermatitis: Secondary | ICD-10-CM | POA: Diagnosis not present

## 2019-02-07 DIAGNOSIS — D2372 Other benign neoplasm of skin of left lower limb, including hip: Secondary | ICD-10-CM | POA: Diagnosis not present

## 2019-02-07 DIAGNOSIS — L570 Actinic keratosis: Secondary | ICD-10-CM | POA: Diagnosis not present

## 2019-02-22 ENCOUNTER — Ambulatory Visit: Payer: Medicare Other

## 2019-03-04 ENCOUNTER — Ambulatory Visit: Payer: Medicare Other

## 2019-03-07 ENCOUNTER — Ambulatory Visit: Payer: Medicare Other | Attending: Internal Medicine

## 2019-03-07 DIAGNOSIS — Z23 Encounter for immunization: Secondary | ICD-10-CM | POA: Insufficient documentation

## 2019-03-07 NOTE — Progress Notes (Signed)
   Covid-19 Vaccination Clinic  Name:  Patrick Miles    MRN: [id number] DOB: 1945-02-20  03/07/2019  Patrick Miles was observed post Covid-19 immunization for 15 minutes without incidence. He was provided with Vaccine Information Sheet and instruction to access the V-Safe system.   Patrick Miles was instructed to call 911 with any severe reactions post vaccine: Marland Kitchen Difficulty breathing  . Swelling of your face and throat  . A fast heartbeat  . A bad rash all over your body  . Dizziness and weakness    Immunizations Administered    Name Date Dose VIS Date Route   Pfizer COVID-19 Vaccine 03/07/2019  9:17 AM 0.3 mL 12/21/2018 Intramuscular   Manufacturer: [place]   Lot: [id number]   [place]: [id number]

## 2019-03-14 DIAGNOSIS — E7849 Other hyperlipidemia: Secondary | ICD-10-CM | POA: Diagnosis not present

## 2019-03-14 DIAGNOSIS — Z125 Encounter for screening for malignant neoplasm of prostate: Secondary | ICD-10-CM | POA: Diagnosis not present

## 2019-03-14 DIAGNOSIS — M8589 Other specified disorders of bone density and structure, multiple sites: Secondary | ICD-10-CM | POA: Diagnosis not present

## 2019-03-19 DIAGNOSIS — I10 Essential (primary) hypertension: Secondary | ICD-10-CM | POA: Diagnosis not present

## 2019-03-19 DIAGNOSIS — H8110 Benign paroxysmal vertigo, unspecified ear: Secondary | ICD-10-CM | POA: Diagnosis not present

## 2019-03-19 DIAGNOSIS — M859 Disorder of bone density and structure, unspecified: Secondary | ICD-10-CM | POA: Diagnosis not present

## 2019-03-19 DIAGNOSIS — D126 Benign neoplasm of colon, unspecified: Secondary | ICD-10-CM | POA: Diagnosis not present

## 2019-03-19 DIAGNOSIS — Z1331 Encounter for screening for depression: Secondary | ICD-10-CM | POA: Diagnosis not present

## 2019-03-19 DIAGNOSIS — Z0000 Encounter for general adult medical examination without abnormal findings: Secondary | ICD-10-CM | POA: Diagnosis not present

## 2019-03-19 DIAGNOSIS — H9190 Unspecified hearing loss, unspecified ear: Secondary | ICD-10-CM | POA: Diagnosis not present

## 2019-03-19 DIAGNOSIS — R82998 Other abnormal findings in urine: Secondary | ICD-10-CM | POA: Diagnosis not present

## 2019-03-19 DIAGNOSIS — I779 Disorder of arteries and arterioles, unspecified: Secondary | ICD-10-CM | POA: Diagnosis not present

## 2019-03-19 DIAGNOSIS — N3281 Overactive bladder: Secondary | ICD-10-CM | POA: Diagnosis not present

## 2019-03-19 DIAGNOSIS — M8580 Other specified disorders of bone density and structure, unspecified site: Secondary | ICD-10-CM | POA: Diagnosis not present

## 2019-03-19 DIAGNOSIS — R9431 Abnormal electrocardiogram [ECG] [EKG]: Secondary | ICD-10-CM | POA: Diagnosis not present

## 2019-03-19 DIAGNOSIS — L989 Disorder of the skin and subcutaneous tissue, unspecified: Secondary | ICD-10-CM | POA: Diagnosis not present

## 2019-03-19 DIAGNOSIS — N401 Enlarged prostate with lower urinary tract symptoms: Secondary | ICD-10-CM | POA: Diagnosis not present

## 2019-03-19 DIAGNOSIS — J449 Chronic obstructive pulmonary disease, unspecified: Secondary | ICD-10-CM | POA: Diagnosis not present

## 2019-03-19 DIAGNOSIS — Z1339 Encounter for screening examination for other mental health and behavioral disorders: Secondary | ICD-10-CM | POA: Diagnosis not present

## 2019-03-19 DIAGNOSIS — L568 Other specified acute skin changes due to ultraviolet radiation: Secondary | ICD-10-CM | POA: Diagnosis not present

## 2019-03-22 ENCOUNTER — Telehealth (HOSPITAL_COMMUNITY): Payer: Self-pay

## 2019-03-22 ENCOUNTER — Other Ambulatory Visit: Payer: Self-pay | Admitting: Internal Medicine

## 2019-03-22 DIAGNOSIS — E785 Hyperlipidemia, unspecified: Secondary | ICD-10-CM

## 2019-03-22 NOTE — Telephone Encounter (Signed)
03/22/19 8:33am LVM for patient to call and schedule appointment/[facility]

## 2019-03-22 NOTE — Telephone Encounter (Signed)
03/20/19 11:35am - Received order from Dr Perini's office to schedule Carotid/[facility]

## 2019-04-05 ENCOUNTER — Ambulatory Visit
Admission: RE | Admit: 2019-04-05 | Discharge: 2019-04-05 | Disposition: A | Discharge status: Home / Self Care | Payer: No Typology Code available for payment source | Source: Ambulatory Visit | Attending: Internal Medicine | Admitting: Internal Medicine

## 2019-04-05 DIAGNOSIS — E785 Hyperlipidemia, unspecified: Secondary | ICD-10-CM

## 2019-04-10 DIAGNOSIS — Z1212 Encounter for screening for malignant neoplasm of rectum: Secondary | ICD-10-CM | POA: Diagnosis not present

## 2019-05-30 DIAGNOSIS — M1711 Unilateral primary osteoarthritis, right knee: Secondary | ICD-10-CM | POA: Diagnosis not present

## 2019-10-07 DIAGNOSIS — H2513 Age-related nuclear cataract, bilateral: Secondary | ICD-10-CM | POA: Diagnosis not present

## 2019-10-07 DIAGNOSIS — H524 Presbyopia: Secondary | ICD-10-CM | POA: Diagnosis not present

## 2019-10-26 DIAGNOSIS — Z23 Encounter for immunization: Secondary | ICD-10-CM | POA: Diagnosis not present

## 2019-10-30 DIAGNOSIS — H2511 Age-related nuclear cataract, right eye: Secondary | ICD-10-CM | POA: Diagnosis not present

## 2019-10-30 DIAGNOSIS — H25811 Combined forms of age-related cataract, right eye: Secondary | ICD-10-CM | POA: Diagnosis not present

## 2019-12-11 DIAGNOSIS — H25812 Combined forms of age-related cataract, left eye: Secondary | ICD-10-CM | POA: Diagnosis not present

## 2019-12-11 DIAGNOSIS — H2512 Age-related nuclear cataract, left eye: Secondary | ICD-10-CM | POA: Diagnosis not present

## 2019-12-24 ENCOUNTER — Ambulatory Visit: Payer: No Typology Code available for payment source

## 2019-12-24 ENCOUNTER — Ambulatory Visit: Payer: Medicare Other | Attending: Internal Medicine

## 2019-12-24 DIAGNOSIS — Z23 Encounter for immunization: Secondary | ICD-10-CM

## 2019-12-24 NOTE — Progress Notes (Signed)
   Covid-19 Vaccination Clinic  Name:  Patrick Miles    MRN: [id number] DOB: March 26, 1945  12/24/2019  [name] was observed post Covid-19 immunization for 15 minutes without incident. He was provided with Vaccine Information Sheet and instruction to access the V-Safe system.   [name] was instructed to call 911 with any severe reactions post vaccine: Marland Kitchen Difficulty breathing  . Swelling of face and throat  . A fast heartbeat  . A bad rash all over body  . Dizziness and weakness   Immunizations Administered    Name Date Dose VIS Date Route   Pfizer COVID-19 Vaccine 12/24/2019  3:46 PM 0.3 mL 10/30/2019 Intramuscular   Manufacturer: [place]   Lot: [id number]   [place]: [id number]

## 2020-02-10 DIAGNOSIS — D692 Other nonthrombocytopenic purpura: Secondary | ICD-10-CM | POA: Diagnosis not present

## 2020-02-10 DIAGNOSIS — B078 Other viral warts: Secondary | ICD-10-CM | POA: Diagnosis not present

## 2020-02-10 DIAGNOSIS — D2372 Other benign neoplasm of skin of left lower limb, including hip: Secondary | ICD-10-CM | POA: Diagnosis not present

## 2020-02-10 DIAGNOSIS — L821 Other seborrheic keratosis: Secondary | ICD-10-CM | POA: Diagnosis not present

## 2020-02-10 DIAGNOSIS — Z85828 Personal history of other malignant neoplasm of skin: Secondary | ICD-10-CM | POA: Diagnosis not present

## 2020-02-10 DIAGNOSIS — L570 Actinic keratosis: Secondary | ICD-10-CM | POA: Diagnosis not present

## 2020-02-10 DIAGNOSIS — L814 Other melanin hyperpigmentation: Secondary | ICD-10-CM | POA: Diagnosis not present

## 2020-02-10 DIAGNOSIS — L820 Inflamed seborrheic keratosis: Secondary | ICD-10-CM | POA: Diagnosis not present

## 2020-02-10 DIAGNOSIS — D225 Melanocytic nevi of trunk: Secondary | ICD-10-CM | POA: Diagnosis not present

## 2020-03-16 DIAGNOSIS — M859 Disorder of bone density and structure, unspecified: Secondary | ICD-10-CM | POA: Diagnosis not present

## 2020-03-16 DIAGNOSIS — Z125 Encounter for screening for malignant neoplasm of prostate: Secondary | ICD-10-CM | POA: Diagnosis not present

## 2020-03-16 DIAGNOSIS — E785 Hyperlipidemia, unspecified: Secondary | ICD-10-CM | POA: Diagnosis not present

## 2020-03-20 DIAGNOSIS — I779 Disorder of arteries and arterioles, unspecified: Secondary | ICD-10-CM | POA: Diagnosis not present

## 2020-03-20 DIAGNOSIS — R918 Other nonspecific abnormal finding of lung field: Secondary | ICD-10-CM | POA: Diagnosis not present

## 2020-03-20 DIAGNOSIS — J449 Chronic obstructive pulmonary disease, unspecified: Secondary | ICD-10-CM | POA: Diagnosis not present

## 2020-03-20 DIAGNOSIS — Z0000 Encounter for general adult medical examination without abnormal findings: Secondary | ICD-10-CM | POA: Diagnosis not present

## 2020-03-20 DIAGNOSIS — H8110 Benign paroxysmal vertigo, unspecified ear: Secondary | ICD-10-CM | POA: Diagnosis not present

## 2020-03-20 DIAGNOSIS — M8580 Other specified disorders of bone density and structure, unspecified site: Secondary | ICD-10-CM | POA: Diagnosis not present

## 2020-03-20 DIAGNOSIS — I10 Essential (primary) hypertension: Secondary | ICD-10-CM | POA: Diagnosis not present

## 2020-03-20 DIAGNOSIS — H9190 Unspecified hearing loss, unspecified ear: Secondary | ICD-10-CM | POA: Diagnosis not present

## 2020-03-20 DIAGNOSIS — R9431 Abnormal electrocardiogram [ECG] [EKG]: Secondary | ICD-10-CM | POA: Diagnosis not present

## 2020-03-20 DIAGNOSIS — Z1331 Encounter for screening for depression: Secondary | ICD-10-CM | POA: Diagnosis not present

## 2020-03-20 DIAGNOSIS — J351 Hypertrophy of tonsils: Secondary | ICD-10-CM | POA: Diagnosis not present

## 2020-03-20 DIAGNOSIS — R82998 Other abnormal findings in urine: Secondary | ICD-10-CM | POA: Diagnosis not present

## 2020-03-20 DIAGNOSIS — R972 Elevated prostate specific antigen [PSA]: Secondary | ICD-10-CM | POA: Diagnosis not present

## 2020-03-20 DIAGNOSIS — Z1212 Encounter for screening for malignant neoplasm of rectum: Secondary | ICD-10-CM | POA: Diagnosis not present

## 2020-07-16 DIAGNOSIS — Z961 Presence of intraocular lens: Secondary | ICD-10-CM | POA: Diagnosis not present

## 2020-07-31 DIAGNOSIS — I10 Essential (primary) hypertension: Secondary | ICD-10-CM | POA: Diagnosis not present

## 2020-07-31 DIAGNOSIS — R051 Acute cough: Secondary | ICD-10-CM | POA: Diagnosis not present

## 2020-07-31 DIAGNOSIS — Z1152 Encounter for screening for COVID-19: Secondary | ICD-10-CM | POA: Diagnosis not present

## 2020-07-31 DIAGNOSIS — J069 Acute upper respiratory infection, unspecified: Secondary | ICD-10-CM | POA: Diagnosis not present

## 2020-08-21 DIAGNOSIS — Z20822 Contact with and (suspected) exposure to covid-19: Secondary | ICD-10-CM | POA: Diagnosis not present

## 2020-08-25 DIAGNOSIS — Z125 Encounter for screening for malignant neoplasm of prostate: Secondary | ICD-10-CM | POA: Diagnosis not present

## 2020-08-25 DIAGNOSIS — R972 Elevated prostate specific antigen [PSA]: Secondary | ICD-10-CM | POA: Diagnosis not present

## 2020-08-31 DIAGNOSIS — Z20822 Contact with and (suspected) exposure to covid-19: Secondary | ICD-10-CM | POA: Diagnosis not present

## 2020-09-13 DIAGNOSIS — Z20822 Contact with and (suspected) exposure to covid-19: Secondary | ICD-10-CM | POA: Diagnosis not present

## 2020-10-14 DIAGNOSIS — R3912 Poor urinary stream: Secondary | ICD-10-CM | POA: Diagnosis not present

## 2020-10-14 DIAGNOSIS — R972 Elevated prostate specific antigen [PSA]: Secondary | ICD-10-CM | POA: Diagnosis not present

## 2020-10-14 DIAGNOSIS — N401 Enlarged prostate with lower urinary tract symptoms: Secondary | ICD-10-CM | POA: Diagnosis not present

## 2020-10-29 ENCOUNTER — Other Ambulatory Visit: Payer: Self-pay | Admitting: Urology

## 2020-10-29 DIAGNOSIS — R972 Elevated prostate specific antigen [PSA]: Secondary | ICD-10-CM

## 2020-11-07 DIAGNOSIS — Z23 Encounter for immunization: Secondary | ICD-10-CM | POA: Diagnosis not present

## 2020-12-01 ENCOUNTER — Other Ambulatory Visit: Payer: Self-pay

## 2020-12-01 ENCOUNTER — Ambulatory Visit
Admission: RE | Admit: 2020-12-01 | Discharge: 2020-12-01 | Disposition: A | Discharge status: Home / Self Care | Payer: Medicare Other | Source: Ambulatory Visit | Attending: Urology | Admitting: Urology

## 2020-12-01 DIAGNOSIS — R972 Elevated prostate specific antigen [PSA]: Secondary | ICD-10-CM | POA: Diagnosis not present

## 2020-12-01 DIAGNOSIS — Z20822 Contact with and (suspected) exposure to covid-19: Secondary | ICD-10-CM | POA: Diagnosis not present

## 2020-12-01 MED ORDER — GADOBENATE DIMEGLUMINE 529 MG/ML IV SOLN
20.0000 mL | Freq: Once | INTRAVENOUS | Status: AC | PRN
  Administered 2020-12-01: 20 mL via INTRAVENOUS

## 2020-12-21 DIAGNOSIS — M1711 Unilateral primary osteoarthritis, right knee: Secondary | ICD-10-CM | POA: Diagnosis not present

## 2021-01-13 DIAGNOSIS — R972 Elevated prostate specific antigen [PSA]: Secondary | ICD-10-CM | POA: Diagnosis not present

## 2021-01-18 DIAGNOSIS — R3912 Poor urinary stream: Secondary | ICD-10-CM | POA: Diagnosis not present

## 2021-01-18 DIAGNOSIS — N401 Enlarged prostate with lower urinary tract symptoms: Secondary | ICD-10-CM | POA: Diagnosis not present

## 2021-01-18 DIAGNOSIS — R972 Elevated prostate specific antigen [PSA]: Secondary | ICD-10-CM | POA: Diagnosis not present

## 2021-01-20 DIAGNOSIS — K648 Other hemorrhoids: Secondary | ICD-10-CM | POA: Diagnosis not present

## 2021-01-20 DIAGNOSIS — D122 Benign neoplasm of ascending colon: Secondary | ICD-10-CM | POA: Diagnosis not present

## 2021-01-20 DIAGNOSIS — D125 Benign neoplasm of sigmoid colon: Secondary | ICD-10-CM | POA: Diagnosis not present

## 2021-01-20 DIAGNOSIS — D120 Benign neoplasm of cecum: Secondary | ICD-10-CM | POA: Diagnosis not present

## 2021-01-20 DIAGNOSIS — D124 Benign neoplasm of descending colon: Secondary | ICD-10-CM | POA: Diagnosis not present

## 2021-01-20 DIAGNOSIS — K5730 Diverticulosis of large intestine without perforation or abscess without bleeding: Secondary | ICD-10-CM | POA: Diagnosis not present

## 2021-01-20 DIAGNOSIS — D123 Benign neoplasm of transverse colon: Secondary | ICD-10-CM | POA: Diagnosis not present

## 2021-01-20 DIAGNOSIS — K644 Residual hemorrhoidal skin tags: Secondary | ICD-10-CM | POA: Diagnosis not present

## 2021-01-20 DIAGNOSIS — Z86010 Personal history of colonic polyps: Secondary | ICD-10-CM | POA: Diagnosis not present

## 2021-01-22 DIAGNOSIS — D123 Benign neoplasm of transverse colon: Secondary | ICD-10-CM | POA: Diagnosis not present

## 2021-01-22 DIAGNOSIS — D120 Benign neoplasm of cecum: Secondary | ICD-10-CM | POA: Diagnosis not present

## 2021-01-22 DIAGNOSIS — D122 Benign neoplasm of ascending colon: Secondary | ICD-10-CM | POA: Diagnosis not present

## 2021-01-22 DIAGNOSIS — D124 Benign neoplasm of descending colon: Secondary | ICD-10-CM | POA: Diagnosis not present

## 2021-01-22 DIAGNOSIS — D125 Benign neoplasm of sigmoid colon: Secondary | ICD-10-CM | POA: Diagnosis not present

## 2021-02-10 DIAGNOSIS — D1801 Hemangioma of skin and subcutaneous tissue: Secondary | ICD-10-CM | POA: Diagnosis not present

## 2021-02-10 DIAGNOSIS — D2371 Other benign neoplasm of skin of right lower limb, including hip: Secondary | ICD-10-CM | POA: Diagnosis not present

## 2021-02-10 DIAGNOSIS — Z85828 Personal history of other malignant neoplasm of skin: Secondary | ICD-10-CM | POA: Diagnosis not present

## 2021-02-10 DIAGNOSIS — L814 Other melanin hyperpigmentation: Secondary | ICD-10-CM | POA: Diagnosis not present

## 2021-02-10 DIAGNOSIS — D485 Neoplasm of uncertain behavior of skin: Secondary | ICD-10-CM | POA: Diagnosis not present

## 2021-02-10 DIAGNOSIS — D692 Other nonthrombocytopenic purpura: Secondary | ICD-10-CM | POA: Diagnosis not present

## 2021-02-10 DIAGNOSIS — D225 Melanocytic nevi of trunk: Secondary | ICD-10-CM | POA: Diagnosis not present

## 2021-02-10 DIAGNOSIS — L570 Actinic keratosis: Secondary | ICD-10-CM | POA: Diagnosis not present

## 2021-02-10 DIAGNOSIS — L821 Other seborrheic keratosis: Secondary | ICD-10-CM | POA: Diagnosis not present

## 2021-02-10 DIAGNOSIS — D2372 Other benign neoplasm of skin of left lower limb, including hip: Secondary | ICD-10-CM | POA: Diagnosis not present

## 2021-02-10 DIAGNOSIS — L309 Dermatitis, unspecified: Secondary | ICD-10-CM | POA: Diagnosis not present

## 2021-03-03 DIAGNOSIS — H903 Sensorineural hearing loss, bilateral: Secondary | ICD-10-CM | POA: Diagnosis not present

## 2021-04-21 DIAGNOSIS — M1711 Unilateral primary osteoarthritis, right knee: Secondary | ICD-10-CM | POA: Diagnosis not present

## 2021-04-30 DIAGNOSIS — Z125 Encounter for screening for malignant neoplasm of prostate: Secondary | ICD-10-CM | POA: Diagnosis not present

## 2021-04-30 DIAGNOSIS — E785 Hyperlipidemia, unspecified: Secondary | ICD-10-CM | POA: Diagnosis not present

## 2021-04-30 DIAGNOSIS — M859 Disorder of bone density and structure, unspecified: Secondary | ICD-10-CM | POA: Diagnosis not present

## 2021-04-30 DIAGNOSIS — I10 Essential (primary) hypertension: Secondary | ICD-10-CM | POA: Diagnosis not present

## 2021-04-30 DIAGNOSIS — Z79899 Other long term (current) drug therapy: Secondary | ICD-10-CM | POA: Diagnosis not present

## 2021-05-07 DIAGNOSIS — Z1339 Encounter for screening examination for other mental health and behavioral disorders: Secondary | ICD-10-CM | POA: Diagnosis not present

## 2021-05-07 DIAGNOSIS — Z1331 Encounter for screening for depression: Secondary | ICD-10-CM | POA: Diagnosis not present

## 2021-05-07 DIAGNOSIS — I10 Essential (primary) hypertension: Secondary | ICD-10-CM | POA: Diagnosis not present

## 2021-05-07 DIAGNOSIS — M1990 Unspecified osteoarthritis, unspecified site: Secondary | ICD-10-CM | POA: Diagnosis not present

## 2021-05-07 DIAGNOSIS — Z0000 Encounter for general adult medical examination without abnormal findings: Secondary | ICD-10-CM | POA: Diagnosis not present

## 2021-05-07 DIAGNOSIS — I2510 Atherosclerotic heart disease of native coronary artery without angina pectoris: Secondary | ICD-10-CM | POA: Diagnosis not present

## 2021-05-07 DIAGNOSIS — N401 Enlarged prostate with lower urinary tract symptoms: Secondary | ICD-10-CM | POA: Diagnosis not present

## 2021-05-07 DIAGNOSIS — Z1212 Encounter for screening for malignant neoplasm of rectum: Secondary | ICD-10-CM | POA: Diagnosis not present

## 2021-05-07 DIAGNOSIS — I779 Disorder of arteries and arterioles, unspecified: Secondary | ICD-10-CM | POA: Diagnosis not present

## 2021-05-07 DIAGNOSIS — J449 Chronic obstructive pulmonary disease, unspecified: Secondary | ICD-10-CM | POA: Diagnosis not present

## 2021-05-07 DIAGNOSIS — R9431 Abnormal electrocardiogram [ECG] [EKG]: Secondary | ICD-10-CM | POA: Diagnosis not present

## 2021-05-07 DIAGNOSIS — R918 Other nonspecific abnormal finding of lung field: Secondary | ICD-10-CM | POA: Diagnosis not present

## 2021-05-07 DIAGNOSIS — R972 Elevated prostate specific antigen [PSA]: Secondary | ICD-10-CM | POA: Diagnosis not present

## 2021-05-07 DIAGNOSIS — R82998 Other abnormal findings in urine: Secondary | ICD-10-CM | POA: Diagnosis not present

## 2021-07-20 DIAGNOSIS — H43813 Vitreous degeneration, bilateral: Secondary | ICD-10-CM | POA: Diagnosis not present

## 2021-07-20 DIAGNOSIS — Z961 Presence of intraocular lens: Secondary | ICD-10-CM | POA: Diagnosis not present

## 2021-07-27 DIAGNOSIS — M1711 Unilateral primary osteoarthritis, right knee: Secondary | ICD-10-CM | POA: Diagnosis not present

## 2021-08-19 DIAGNOSIS — M1711 Unilateral primary osteoarthritis, right knee: Secondary | ICD-10-CM | POA: Diagnosis not present

## 2021-10-18 DIAGNOSIS — M1712 Unilateral primary osteoarthritis, left knee: Secondary | ICD-10-CM | POA: Diagnosis not present

## 2021-10-18 DIAGNOSIS — M25561 Pain in right knee: Secondary | ICD-10-CM | POA: Diagnosis not present

## 2021-10-18 DIAGNOSIS — M1711 Unilateral primary osteoarthritis, right knee: Secondary | ICD-10-CM | POA: Diagnosis not present

## 2021-10-18 DIAGNOSIS — Z01812 Encounter for preprocedural laboratory examination: Secondary | ICD-10-CM | POA: Diagnosis not present

## 2021-10-20 DIAGNOSIS — R059 Cough, unspecified: Secondary | ICD-10-CM | POA: Diagnosis not present

## 2021-10-20 DIAGNOSIS — R5383 Other fatigue: Secondary | ICD-10-CM | POA: Diagnosis not present

## 2021-10-20 DIAGNOSIS — R0981 Nasal congestion: Secondary | ICD-10-CM | POA: Diagnosis not present

## 2021-10-20 DIAGNOSIS — J0100 Acute maxillary sinusitis, unspecified: Secondary | ICD-10-CM | POA: Diagnosis not present

## 2021-10-20 DIAGNOSIS — R972 Elevated prostate specific antigen [PSA]: Secondary | ICD-10-CM | POA: Diagnosis not present

## 2021-10-20 DIAGNOSIS — Z1152 Encounter for screening for COVID-19: Secondary | ICD-10-CM | POA: Diagnosis not present

## 2021-10-25 DIAGNOSIS — M1711 Unilateral primary osteoarthritis, right knee: Secondary | ICD-10-CM | POA: Diagnosis not present

## 2021-10-27 DIAGNOSIS — R3912 Poor urinary stream: Secondary | ICD-10-CM | POA: Diagnosis not present

## 2021-10-27 DIAGNOSIS — N401 Enlarged prostate with lower urinary tract symptoms: Secondary | ICD-10-CM | POA: Diagnosis not present

## 2021-11-04 ENCOUNTER — Telehealth (HOSPITAL_COMMUNITY): Payer: Self-pay | Admitting: Orthopedic Surgery

## 2021-11-04 MED ORDER — OXYCODONE HCL 5 MG PO TABS: 5.0000 mg | ORAL_TABLET | ORAL | 0 refills | Status: AC | PRN

## 2021-11-04 MED ORDER — ONDANSETRON HCL 4 MG PO TABS: 4.0000 mg | ORAL_TABLET | Freq: Three times a day (TID) | ORAL | 0 refills | Status: AC | PRN

## 2021-11-04 MED ORDER — METHOCARBAMOL 500 MG PO TABS: 500.0000 mg | ORAL_TABLET | Freq: Three times a day (TID) | ORAL | 0 refills | Status: AC | PRN

## 2021-11-05 DIAGNOSIS — G8918 Other acute postprocedural pain: Secondary | ICD-10-CM | POA: Diagnosis not present

## 2021-11-05 DIAGNOSIS — M1711 Unilateral primary osteoarthritis, right knee: Secondary | ICD-10-CM | POA: Diagnosis not present

## 2021-11-07 NOTE — Telephone Encounter (Signed)
Post-op meds sent

## 2021-11-08 DIAGNOSIS — M1711 Unilateral primary osteoarthritis, right knee: Secondary | ICD-10-CM | POA: Diagnosis not present

## 2021-11-10 DIAGNOSIS — M1711 Unilateral primary osteoarthritis, right knee: Secondary | ICD-10-CM | POA: Diagnosis not present

## 2021-11-15 DIAGNOSIS — M1711 Unilateral primary osteoarthritis, right knee: Secondary | ICD-10-CM | POA: Diagnosis not present

## 2021-11-18 DIAGNOSIS — M1711 Unilateral primary osteoarthritis, right knee: Secondary | ICD-10-CM | POA: Diagnosis not present

## 2021-11-19 DIAGNOSIS — M1711 Unilateral primary osteoarthritis, right knee: Secondary | ICD-10-CM | POA: Diagnosis not present

## 2021-11-22 DIAGNOSIS — M1711 Unilateral primary osteoarthritis, right knee: Secondary | ICD-10-CM | POA: Diagnosis not present

## 2021-11-25 DIAGNOSIS — M1711 Unilateral primary osteoarthritis, right knee: Secondary | ICD-10-CM | POA: Diagnosis not present

## 2021-11-29 DIAGNOSIS — M1711 Unilateral primary osteoarthritis, right knee: Secondary | ICD-10-CM | POA: Diagnosis not present

## 2021-12-01 DIAGNOSIS — M1711 Unilateral primary osteoarthritis, right knee: Secondary | ICD-10-CM | POA: Diagnosis not present

## 2021-12-06 DIAGNOSIS — M1711 Unilateral primary osteoarthritis, right knee: Secondary | ICD-10-CM | POA: Diagnosis not present

## 2021-12-09 DIAGNOSIS — M1711 Unilateral primary osteoarthritis, right knee: Secondary | ICD-10-CM | POA: Diagnosis not present

## 2021-12-13 DIAGNOSIS — M1711 Unilateral primary osteoarthritis, right knee: Secondary | ICD-10-CM | POA: Diagnosis not present

## 2021-12-16 DIAGNOSIS — M1711 Unilateral primary osteoarthritis, right knee: Secondary | ICD-10-CM | POA: Diagnosis not present

## 2021-12-20 DIAGNOSIS — M1711 Unilateral primary osteoarthritis, right knee: Secondary | ICD-10-CM | POA: Diagnosis not present

## 2021-12-23 DIAGNOSIS — M1711 Unilateral primary osteoarthritis, right knee: Secondary | ICD-10-CM | POA: Diagnosis not present

## 2021-12-27 DIAGNOSIS — M1711 Unilateral primary osteoarthritis, right knee: Secondary | ICD-10-CM | POA: Diagnosis not present

## 2021-12-30 DIAGNOSIS — M1711 Unilateral primary osteoarthritis, right knee: Secondary | ICD-10-CM | POA: Diagnosis not present

## 2022-01-11 DIAGNOSIS — Z23 Encounter for immunization: Secondary | ICD-10-CM | POA: Diagnosis not present

## 2022-01-12 DIAGNOSIS — M1711 Unilateral primary osteoarthritis, right knee: Secondary | ICD-10-CM | POA: Diagnosis not present

## 2022-01-18 DIAGNOSIS — M1711 Unilateral primary osteoarthritis, right knee: Secondary | ICD-10-CM | POA: Diagnosis not present

## 2022-01-26 DIAGNOSIS — M9903 Segmental and somatic dysfunction of lumbar region: Secondary | ICD-10-CM | POA: Diagnosis not present

## 2022-01-26 DIAGNOSIS — S336XXA Sprain of sacroiliac joint, initial encounter: Secondary | ICD-10-CM | POA: Diagnosis not present

## 2022-01-28 DIAGNOSIS — M9903 Segmental and somatic dysfunction of lumbar region: Secondary | ICD-10-CM | POA: Diagnosis not present

## 2022-01-28 DIAGNOSIS — S336XXA Sprain of sacroiliac joint, initial encounter: Secondary | ICD-10-CM | POA: Diagnosis not present

## 2022-02-04 DIAGNOSIS — M9903 Segmental and somatic dysfunction of lumbar region: Secondary | ICD-10-CM | POA: Diagnosis not present

## 2022-02-04 DIAGNOSIS — S336XXA Sprain of sacroiliac joint, initial encounter: Secondary | ICD-10-CM | POA: Diagnosis not present

## 2022-02-10 DIAGNOSIS — L821 Other seborrheic keratosis: Secondary | ICD-10-CM | POA: Diagnosis not present

## 2022-02-10 DIAGNOSIS — D2262 Melanocytic nevi of left upper limb, including shoulder: Secondary | ICD-10-CM | POA: Diagnosis not present

## 2022-02-10 DIAGNOSIS — D2371 Other benign neoplasm of skin of right lower limb, including hip: Secondary | ICD-10-CM | POA: Diagnosis not present

## 2022-02-10 DIAGNOSIS — L570 Actinic keratosis: Secondary | ICD-10-CM | POA: Diagnosis not present

## 2022-02-10 DIAGNOSIS — D2272 Melanocytic nevi of left lower limb, including hip: Secondary | ICD-10-CM | POA: Diagnosis not present

## 2022-02-10 DIAGNOSIS — Z85828 Personal history of other malignant neoplasm of skin: Secondary | ICD-10-CM | POA: Diagnosis not present

## 2022-02-10 DIAGNOSIS — D2372 Other benign neoplasm of skin of left lower limb, including hip: Secondary | ICD-10-CM | POA: Diagnosis not present

## 2022-02-10 DIAGNOSIS — D2271 Melanocytic nevi of right lower limb, including hip: Secondary | ICD-10-CM | POA: Diagnosis not present

## 2022-02-10 DIAGNOSIS — D225 Melanocytic nevi of trunk: Secondary | ICD-10-CM | POA: Diagnosis not present

## 2022-02-10 DIAGNOSIS — L814 Other melanin hyperpigmentation: Secondary | ICD-10-CM | POA: Diagnosis not present

## 2022-02-10 DIAGNOSIS — D2261 Melanocytic nevi of right upper limb, including shoulder: Secondary | ICD-10-CM | POA: Diagnosis not present

## 2022-02-11 DIAGNOSIS — M9903 Segmental and somatic dysfunction of lumbar region: Secondary | ICD-10-CM | POA: Diagnosis not present

## 2022-02-11 DIAGNOSIS — S336XXA Sprain of sacroiliac joint, initial encounter: Secondary | ICD-10-CM | POA: Diagnosis not present

## 2022-02-28 DIAGNOSIS — M9903 Segmental and somatic dysfunction of lumbar region: Secondary | ICD-10-CM | POA: Diagnosis not present

## 2022-02-28 DIAGNOSIS — S336XXA Sprain of sacroiliac joint, initial encounter: Secondary | ICD-10-CM | POA: Diagnosis not present

## 2022-05-23 DIAGNOSIS — S336XXA Sprain of sacroiliac joint, initial encounter: Secondary | ICD-10-CM | POA: Diagnosis not present

## 2022-05-23 DIAGNOSIS — M9903 Segmental and somatic dysfunction of lumbar region: Secondary | ICD-10-CM | POA: Diagnosis not present

## 2022-06-14 DIAGNOSIS — M8580 Other specified disorders of bone density and structure, unspecified site: Secondary | ICD-10-CM | POA: Diagnosis not present

## 2022-06-14 DIAGNOSIS — E785 Hyperlipidemia, unspecified: Secondary | ICD-10-CM | POA: Diagnosis not present

## 2022-06-14 DIAGNOSIS — Z125 Encounter for screening for malignant neoplasm of prostate: Secondary | ICD-10-CM | POA: Diagnosis not present

## 2022-06-14 DIAGNOSIS — N529 Male erectile dysfunction, unspecified: Secondary | ICD-10-CM | POA: Diagnosis not present

## 2022-06-14 DIAGNOSIS — K219 Gastro-esophageal reflux disease without esophagitis: Secondary | ICD-10-CM | POA: Diagnosis not present

## 2022-06-14 DIAGNOSIS — I10 Essential (primary) hypertension: Secondary | ICD-10-CM | POA: Diagnosis not present

## 2022-06-16 DIAGNOSIS — M8589 Other specified disorders of bone density and structure, multiple sites: Secondary | ICD-10-CM | POA: Diagnosis not present

## 2022-06-21 DIAGNOSIS — R972 Elevated prostate specific antigen [PSA]: Secondary | ICD-10-CM | POA: Diagnosis not present

## 2022-06-21 DIAGNOSIS — I2510 Atherosclerotic heart disease of native coronary artery without angina pectoris: Secondary | ICD-10-CM | POA: Diagnosis not present

## 2022-06-21 DIAGNOSIS — R82998 Other abnormal findings in urine: Secondary | ICD-10-CM | POA: Diagnosis not present

## 2022-06-21 DIAGNOSIS — E785 Hyperlipidemia, unspecified: Secondary | ICD-10-CM | POA: Diagnosis not present

## 2022-06-21 DIAGNOSIS — I10 Essential (primary) hypertension: Secondary | ICD-10-CM | POA: Diagnosis not present

## 2022-06-21 DIAGNOSIS — R918 Other nonspecific abnormal finding of lung field: Secondary | ICD-10-CM | POA: Diagnosis not present

## 2022-06-21 DIAGNOSIS — R7301 Impaired fasting glucose: Secondary | ICD-10-CM | POA: Diagnosis not present

## 2022-06-21 DIAGNOSIS — H9190 Unspecified hearing loss, unspecified ear: Secondary | ICD-10-CM | POA: Diagnosis not present

## 2022-06-21 DIAGNOSIS — L989 Disorder of the skin and subcutaneous tissue, unspecified: Secondary | ICD-10-CM | POA: Diagnosis not present

## 2022-06-21 DIAGNOSIS — M8580 Other specified disorders of bone density and structure, unspecified site: Secondary | ICD-10-CM | POA: Diagnosis not present

## 2022-06-21 DIAGNOSIS — J449 Chronic obstructive pulmonary disease, unspecified: Secondary | ICD-10-CM | POA: Diagnosis not present

## 2022-06-21 DIAGNOSIS — Z0000 Encounter for general adult medical examination without abnormal findings: Secondary | ICD-10-CM | POA: Diagnosis not present

## 2022-07-01 IMAGING — MR MR PROSTATE WO/W CM
12 series · 48 of 48 positions shown · IV contrast (multihance)
Comparison: None.

CLINICAL DATA: Elevated PSA.

EXAM:
MR PROSTATE WITHOUT AND WITH CONTRAST
TECHNIQUE: Multiplanar multisequence MRI images were obtained of the pelvis
centered about the prostate. Pre and post contrast images were
obtained.
CONTRAST:  20mL MULTIHANCE GADOBENATE DIMEGLUMINE 529 MG/ML IV SOLN

[Series 3: T2 · coronal · 3.0mm · 0.56mm/px · 1 of 25 slices shown (1 of 3)]
[im 1/25]
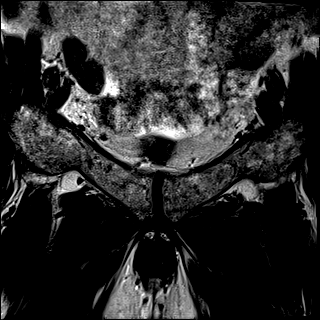

[Series 4: T1 · axial · 5.0mm · 1.25mm/px · z∈[-25,+170]mm · 2 of 80 slices shown]
[im 1/80]
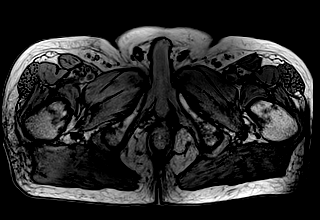
[im 80/80]
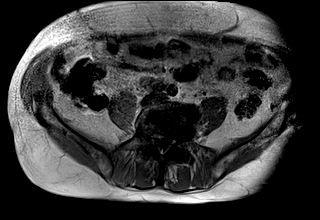

[Series 5: DWI · axial · 3.0mm · 1.75mm/px · z∈[-13,+53]mm · 2 of 69 slices shown (1 of 3)]
[im 1/69]
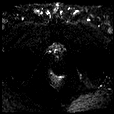
[im 69/69]
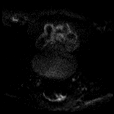

[Series 6: DWI · axial · 3.0mm · 1.75mm/px · 1 of 23 slices shown (2 of 3)]
[im 1/23]
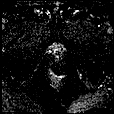

[Series 7: DWI · axial · 3.0mm · 1.75mm/px · 1 of 23 slices shown (3 of 3)]
[im 1/23]
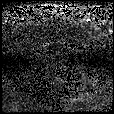

[Series 8: T2 · axial · 3.0mm · 0.56mm/px · 1 of 23 slices shown (2 of 3)]
[im 1/23]
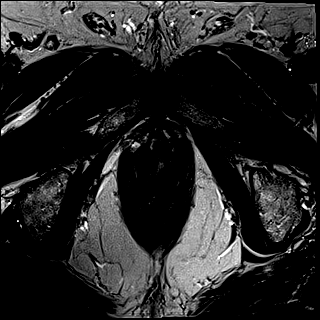

[Series 9: T2 · axial · 1.0mm · 1.04mm/px · z∈[-16,+55]mm · 2 of 72 slices shown (3 of 3)]
[im 1/72]
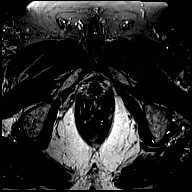
[im 72/72]
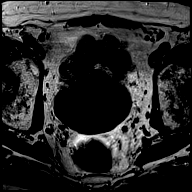

[Series 10: pre t1_twist_tra_dyn · axial · non-contrast · 3.5mm · 0.83mm/px · 1 of 20 slices shown]
[im 1/20]
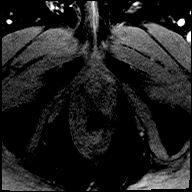

[Series 11: post t1_twist_tra_dyn-copy center · axial · non-contrast · 3.5mm · 0.83mm/px · z∈[-16,+50]mm · 17 of 600 slices shown]
[im 1/600]
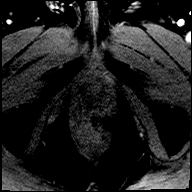
[im 38/600]
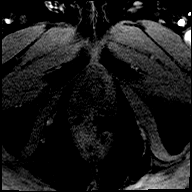
[im 75/600]
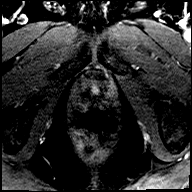
[im 113/600]
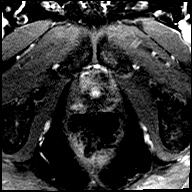
[im 150/600]
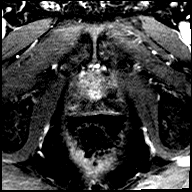
[im 188/600]
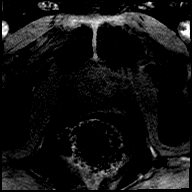
[im 225/600]
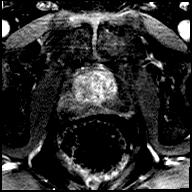
[im 263/600]
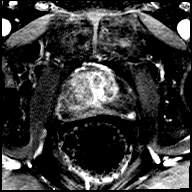
[im 300/600]
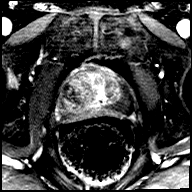
[im 337/600]
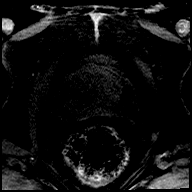
[im 375/600]
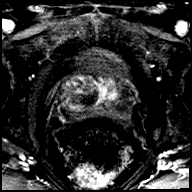
[im 412/600]
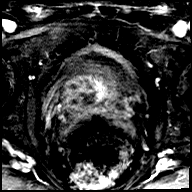
[im 450/600]
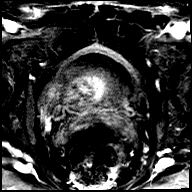
[im 487/600]
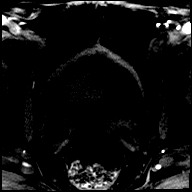
[im 525/600]
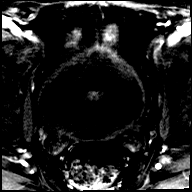
[im 562/600]
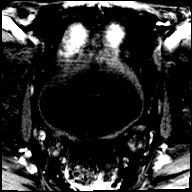
[im 600/600]
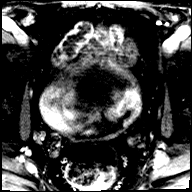

[Series 12: post t1_twist_tra_dyn-copy cent_sub · axial · 3.5mm · 0.83mm/px · z∈[-16,+50]mm · 16 of 567 slices shown]
[im 1/567]
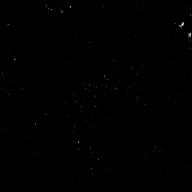
[im 38/567]
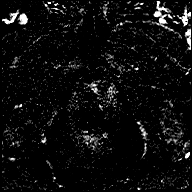
[im 76/567]
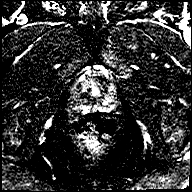
[im 114/567]
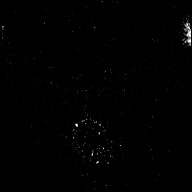
[im 151/567]
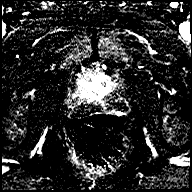
[im 189/567]
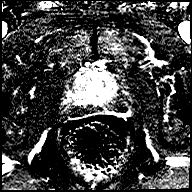
[im 227/567]
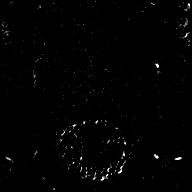
[im 265/567]
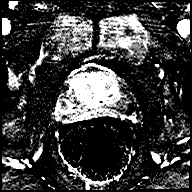
[im 302/567]
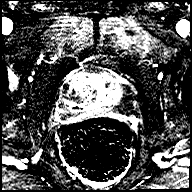
[im 340/567]
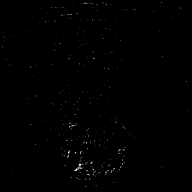
[im 378/567]
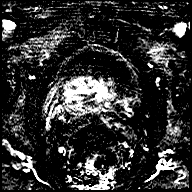
[im 416/567]
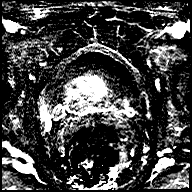
[im 453/567]
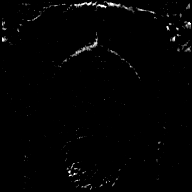
[im 491/567]
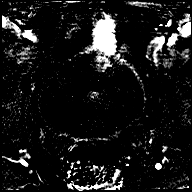
[im 529/567]
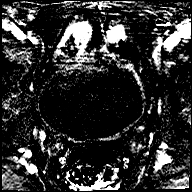
[im 567/567]
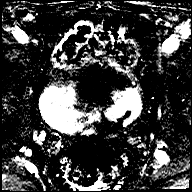

[Series 13: t1_vibe_dixon_tra_f · axial · 2.5mm · 0.91mm/px · z∈[-26,+171]mm · 2 of 80 slices shown]
[im 1/80]
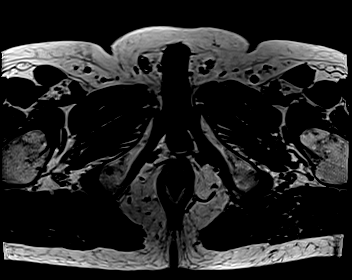
[im 80/80]
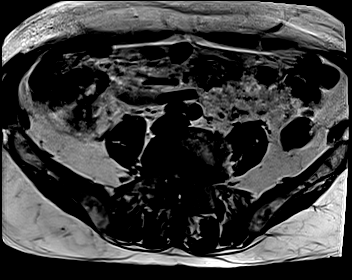

[Series 14: t1_vibe_dixon_tra_w · axial · 2.5mm · 0.91mm/px · z∈[-26,+171]mm · 2 of 80 slices shown]
[im 1/80]
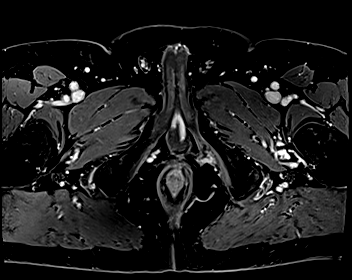
[im 80/80]
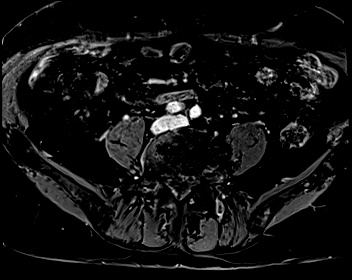

[48 of 48 positions shown; findings below may reference images not displayed]

FINDINGS: Prostate:

-- Peripheral Zone: No focal lesion seen on ADC or high b-value DWI
sequences.

-- Transition/Central Zone: Enlarged with encapsulated BPH nodules
noted, with dominant nodule in the right base measuring 3 cm.
However, no suspicious nodules are identified on T2-weighted or
diffusion sequences.

-- Measurements/Volume:  5.4 by 4.2 x 6.5 cm (volume = 77 cm^3)

Transcapsular spread:  Absent

Seminal vesicle involvement:  Absent

Neurovascular bundle involvement:  Absent

Pelvic adenopathy: None visualized

Bone metastasis: None visualized

Other:  None
IMPRESSION: No radiographic evidence of high-grade prostate carcinoma. PI-RADS 1
(v2.1): Very Low (clinically significant cancer highly unlikely)

## 2022-08-19 DIAGNOSIS — Z961 Presence of intraocular lens: Secondary | ICD-10-CM | POA: Diagnosis not present

## 2022-08-22 DIAGNOSIS — S336XXA Sprain of sacroiliac joint, initial encounter: Secondary | ICD-10-CM | POA: Diagnosis not present

## 2022-08-22 DIAGNOSIS — M9903 Segmental and somatic dysfunction of lumbar region: Secondary | ICD-10-CM | POA: Diagnosis not present

## 2022-10-18 DIAGNOSIS — S61243A Puncture wound with foreign body of left middle finger without damage to nail, initial encounter: Secondary | ICD-10-CM | POA: Diagnosis not present

## 2022-10-24 DIAGNOSIS — N401 Enlarged prostate with lower urinary tract symptoms: Secondary | ICD-10-CM | POA: Diagnosis not present

## 2022-10-27 DIAGNOSIS — R972 Elevated prostate specific antigen [PSA]: Secondary | ICD-10-CM | POA: Diagnosis not present

## 2022-10-27 DIAGNOSIS — N401 Enlarged prostate with lower urinary tract symptoms: Secondary | ICD-10-CM | POA: Diagnosis not present

## 2022-10-27 DIAGNOSIS — R3912 Poor urinary stream: Secondary | ICD-10-CM | POA: Diagnosis not present

## 2022-11-17 DIAGNOSIS — Z23 Encounter for immunization: Secondary | ICD-10-CM | POA: Diagnosis not present

## 2022-11-21 DIAGNOSIS — S336XXA Sprain of sacroiliac joint, initial encounter: Secondary | ICD-10-CM | POA: Diagnosis not present

## 2022-11-21 DIAGNOSIS — M9903 Segmental and somatic dysfunction of lumbar region: Secondary | ICD-10-CM | POA: Diagnosis not present

## 2023-02-13 DIAGNOSIS — L570 Actinic keratosis: Secondary | ICD-10-CM | POA: Diagnosis not present

## 2023-02-13 DIAGNOSIS — D692 Other nonthrombocytopenic purpura: Secondary | ICD-10-CM | POA: Diagnosis not present

## 2023-02-13 DIAGNOSIS — L814 Other melanin hyperpigmentation: Secondary | ICD-10-CM | POA: Diagnosis not present

## 2023-02-13 DIAGNOSIS — D1801 Hemangioma of skin and subcutaneous tissue: Secondary | ICD-10-CM | POA: Diagnosis not present

## 2023-02-13 DIAGNOSIS — Z85828 Personal history of other malignant neoplasm of skin: Secondary | ICD-10-CM | POA: Diagnosis not present

## 2023-02-13 DIAGNOSIS — D2372 Other benign neoplasm of skin of left lower limb, including hip: Secondary | ICD-10-CM | POA: Diagnosis not present

## 2023-02-13 DIAGNOSIS — L821 Other seborrheic keratosis: Secondary | ICD-10-CM | POA: Diagnosis not present

## 2023-05-17 DIAGNOSIS — D125 Benign neoplasm of sigmoid colon: Secondary | ICD-10-CM | POA: Diagnosis not present

## 2023-05-17 DIAGNOSIS — D122 Benign neoplasm of ascending colon: Secondary | ICD-10-CM | POA: Diagnosis not present

## 2023-05-17 DIAGNOSIS — K5730 Diverticulosis of large intestine without perforation or abscess without bleeding: Secondary | ICD-10-CM | POA: Diagnosis not present

## 2023-05-17 DIAGNOSIS — K635 Polyp of colon: Secondary | ICD-10-CM | POA: Diagnosis not present

## 2023-05-17 DIAGNOSIS — Z860101 Personal history of adenomatous and serrated colon polyps: Secondary | ICD-10-CM | POA: Diagnosis not present

## 2023-05-17 DIAGNOSIS — Z09 Encounter for follow-up examination after completed treatment for conditions other than malignant neoplasm: Secondary | ICD-10-CM | POA: Diagnosis not present

## 2023-05-17 DIAGNOSIS — D123 Benign neoplasm of transverse colon: Secondary | ICD-10-CM | POA: Diagnosis not present

## 2023-05-18 DIAGNOSIS — H903 Sensorineural hearing loss, bilateral: Secondary | ICD-10-CM | POA: Diagnosis not present

## 2023-05-19 DIAGNOSIS — D122 Benign neoplasm of ascending colon: Secondary | ICD-10-CM | POA: Diagnosis not present

## 2023-05-19 DIAGNOSIS — K635 Polyp of colon: Secondary | ICD-10-CM | POA: Diagnosis not present

## 2023-05-19 DIAGNOSIS — D123 Benign neoplasm of transverse colon: Secondary | ICD-10-CM | POA: Diagnosis not present

## 2023-05-19 DIAGNOSIS — D125 Benign neoplasm of sigmoid colon: Secondary | ICD-10-CM | POA: Diagnosis not present

## 2023-05-22 DIAGNOSIS — M9903 Segmental and somatic dysfunction of lumbar region: Secondary | ICD-10-CM | POA: Diagnosis not present

## 2023-05-22 DIAGNOSIS — S336XXA Sprain of sacroiliac joint, initial encounter: Secondary | ICD-10-CM | POA: Diagnosis not present

## 2023-08-21 DIAGNOSIS — M9903 Segmental and somatic dysfunction of lumbar region: Secondary | ICD-10-CM | POA: Diagnosis not present

## 2023-08-21 DIAGNOSIS — S336XXA Sprain of sacroiliac joint, initial encounter: Secondary | ICD-10-CM | POA: Diagnosis not present

## 2023-08-29 DIAGNOSIS — D3131 Benign neoplasm of right choroid: Secondary | ICD-10-CM | POA: Diagnosis not present

## 2023-08-29 DIAGNOSIS — Z961 Presence of intraocular lens: Secondary | ICD-10-CM | POA: Diagnosis not present

## 2023-10-04 DIAGNOSIS — R972 Elevated prostate specific antigen [PSA]: Secondary | ICD-10-CM | POA: Diagnosis not present

## 2023-10-04 DIAGNOSIS — M8580 Other specified disorders of bone density and structure, unspecified site: Secondary | ICD-10-CM | POA: Diagnosis not present

## 2023-10-04 DIAGNOSIS — I2510 Atherosclerotic heart disease of native coronary artery without angina pectoris: Secondary | ICD-10-CM | POA: Diagnosis not present

## 2023-10-04 DIAGNOSIS — I10 Essential (primary) hypertension: Secondary | ICD-10-CM | POA: Diagnosis not present

## 2023-10-04 DIAGNOSIS — E785 Hyperlipidemia, unspecified: Secondary | ICD-10-CM | POA: Diagnosis not present

## 2023-10-04 DIAGNOSIS — K219 Gastro-esophageal reflux disease without esophagitis: Secondary | ICD-10-CM | POA: Diagnosis not present

## 2023-10-04 DIAGNOSIS — Z0189 Encounter for other specified special examinations: Secondary | ICD-10-CM | POA: Diagnosis not present

## 2023-10-11 DIAGNOSIS — D126 Benign neoplasm of colon, unspecified: Secondary | ICD-10-CM | POA: Diagnosis not present

## 2023-10-11 DIAGNOSIS — N401 Enlarged prostate with lower urinary tract symptoms: Secondary | ICD-10-CM | POA: Diagnosis not present

## 2023-10-11 DIAGNOSIS — R0600 Dyspnea, unspecified: Secondary | ICD-10-CM | POA: Diagnosis not present

## 2023-10-11 DIAGNOSIS — R7301 Impaired fasting glucose: Secondary | ICD-10-CM | POA: Diagnosis not present

## 2023-10-11 DIAGNOSIS — E785 Hyperlipidemia, unspecified: Secondary | ICD-10-CM | POA: Diagnosis not present

## 2023-10-11 DIAGNOSIS — R972 Elevated prostate specific antigen [PSA]: Secondary | ICD-10-CM | POA: Diagnosis not present

## 2023-10-11 DIAGNOSIS — Z0000 Encounter for general adult medical examination without abnormal findings: Secondary | ICD-10-CM | POA: Diagnosis not present

## 2023-10-11 DIAGNOSIS — J449 Chronic obstructive pulmonary disease, unspecified: Secondary | ICD-10-CM | POA: Diagnosis not present

## 2023-10-11 DIAGNOSIS — I2510 Atherosclerotic heart disease of native coronary artery without angina pectoris: Secondary | ICD-10-CM | POA: Diagnosis not present

## 2023-10-11 DIAGNOSIS — R82998 Other abnormal findings in urine: Secondary | ICD-10-CM | POA: Diagnosis not present

## 2023-10-11 DIAGNOSIS — H9190 Unspecified hearing loss, unspecified ear: Secondary | ICD-10-CM | POA: Diagnosis not present

## 2023-10-11 DIAGNOSIS — Z1331 Encounter for screening for depression: Secondary | ICD-10-CM | POA: Diagnosis not present

## 2023-10-11 DIAGNOSIS — I10 Essential (primary) hypertension: Secondary | ICD-10-CM | POA: Diagnosis not present

## 2023-10-14 DIAGNOSIS — Z23 Encounter for immunization: Secondary | ICD-10-CM | POA: Diagnosis not present

## 2023-10-26 DIAGNOSIS — N401 Enlarged prostate with lower urinary tract symptoms: Secondary | ICD-10-CM | POA: Diagnosis not present

## 2023-10-26 DIAGNOSIS — R3912 Poor urinary stream: Secondary | ICD-10-CM | POA: Diagnosis not present

## 2023-10-26 DIAGNOSIS — R972 Elevated prostate specific antigen [PSA]: Secondary | ICD-10-CM | POA: Diagnosis not present

## 2023-11-09 DIAGNOSIS — Z23 Encounter for immunization: Secondary | ICD-10-CM | POA: Diagnosis not present

## 2023-11-13 DIAGNOSIS — S336XXA Sprain of sacroiliac joint, initial encounter: Secondary | ICD-10-CM | POA: Diagnosis not present

## 2023-11-13 DIAGNOSIS — M9903 Segmental and somatic dysfunction of lumbar region: Secondary | ICD-10-CM | POA: Diagnosis not present
